# Patient Record
Sex: Female | Born: 1935
Health system: Southern US, Community
[De-identification: ages and names within clinical notes are randomized; demographics above are authoritative.]

---

## 2017-10-02 ENCOUNTER — Inpatient Hospital Stay
Admission: AC | Admit: 2017-10-02 | Discharge: 2017-10-25 | Disposition: A | Discharge status: Skilled Nursing Facility | Payer: Self-pay | Source: Other Acute Inpatient Hospital | Attending: Internal Medicine | Admitting: Internal Medicine

## 2017-10-02 DIAGNOSIS — Z9889 Other specified postprocedural states: Secondary | ICD-10-CM

## 2017-10-02 DIAGNOSIS — R110 Nausea: Secondary | ICD-10-CM

## 2017-10-02 DIAGNOSIS — I509 Heart failure, unspecified: Secondary | ICD-10-CM

## 2017-10-02 DIAGNOSIS — J9690 Respiratory failure, unspecified, unspecified whether with hypoxia or hypercapnia: Secondary | ICD-10-CM

## 2017-10-02 DIAGNOSIS — Z4659 Encounter for fitting and adjustment of other gastrointestinal appliance and device: Secondary | ICD-10-CM

## 2017-10-02 DIAGNOSIS — J90 Pleural effusion, not elsewhere classified: Secondary | ICD-10-CM

## 2017-10-03 ENCOUNTER — Other Ambulatory Visit (HOSPITAL_COMMUNITY): Payer: Self-pay

## 2017-10-03 LAB — BASIC METABOLIC PANEL
Anion gap: 5 (ref 5–15)
BUN: 8 mg/dL (ref 8–23)
CALCIUM: 8 mg/dL — AB (ref 8.9–10.3)
CO2: 26 mmol/L (ref 22–32)
CREATININE: 2.2 mg/dL — AB (ref 0.44–1.00)
Chloride: 102 mmol/L (ref 98–111)
GFR, EST AFRICAN AMERICAN: 22 mL/min — AB (ref >60)
GFR, EST NON AFRICAN AMERICAN: 19 mL/min — AB (ref >60)
GLUCOSE: 90 mg/dL (ref 70–99)
Potassium: 3.6 mmol/L (ref 3.5–5.1)
Sodium: 133 mmol/L — ABNORMAL LOW (ref 135–145)

## 2017-10-03 LAB — CBC WITH DIFFERENTIAL/PLATELET
Abs Immature Granulocytes: 0.2 10*3/uL — ABNORMAL HIGH (ref 0.0–0.1)
BASOS PCT: 1 %
Basophils Absolute: 0.1 10*3/uL (ref 0.0–0.1)
EOS ABS: 0.5 10*3/uL (ref 0.0–0.7)
EOS PCT: 4 %
HEMATOCRIT: 27.1 % — AB (ref 36.0–46.0)
Hemoglobin: 8.6 g/dL — ABNORMAL LOW (ref 12.0–15.0)
Immature Granulocytes: 2 %
LYMPHS ABS: 1.2 10*3/uL (ref 0.7–4.0)
Lymphocytes Relative: 9 %
MCH: 28.1 pg (ref 26.0–34.0)
MCHC: 31.7 g/dL (ref 30.0–36.0)
MCV: 88.6 fL (ref 78.0–100.0)
Monocytes Absolute: 1.6 10*3/uL — ABNORMAL HIGH (ref 0.1–1.0)
Monocytes Relative: 13 %
NEUTROS PCT: 71 %
Neutro Abs: 9.3 10*3/uL — ABNORMAL HIGH (ref 1.7–7.7)
PLATELETS: 298 10*3/uL (ref 150–400)
RBC: 3.06 MIL/uL — AB (ref 3.87–5.11)
RDW: 16.5 % — AB (ref 11.5–15.5)
WBC: 13 10*3/uL — ABNORMAL HIGH (ref 4.0–10.5)

## 2017-10-03 LAB — HEPATIC FUNCTION PANEL
ALBUMIN: 2.2 g/dL — AB (ref 3.5–5.0)
ALT: 12 U/L (ref 0–44)
AST: 22 U/L (ref 15–41)
Alkaline Phosphatase: 74 U/L (ref 38–126)
BILIRUBIN TOTAL: 0.8 mg/dL (ref 0.3–1.2)
Bilirubin, Direct: 0.2 mg/dL (ref 0.0–0.2)
Indirect Bilirubin: 0.6 mg/dL (ref 0.3–0.9)
TOTAL PROTEIN: 5.4 g/dL — AB (ref 6.5–8.1)

## 2017-10-03 LAB — MAGNESIUM: MAGNESIUM: 1.7 mg/dL (ref 1.7–2.4)

## 2017-10-03 LAB — PROTIME-INR
INR: 1.19
PROTHROMBIN TIME: 15 s (ref 11.4–15.2)

## 2017-10-03 LAB — APTT: APTT: 39 s — AB (ref 24–36)

## 2017-10-04 ENCOUNTER — Other Ambulatory Visit (HOSPITAL_COMMUNITY): Payer: Self-pay

## 2017-10-04 LAB — GRAM STAIN

## 2017-10-04 LAB — CK TOTAL AND CKMB (NOT AT ARMC)
CK TOTAL: 51 U/L (ref 38–234)
CK, MB: 3.6 ng/mL (ref 0.5–5.0)
CK, MB: 4.1 ng/mL (ref 0.5–5.0)
CK, MB: 4 ng/mL (ref 0.5–5.0)
RELATIVE INDEX: INVALID (ref 0.0–2.5)
Relative Index: INVALID (ref 0.0–2.5)
Relative Index: INVALID (ref 0.0–2.5)
Total CK: 29 U/L — ABNORMAL LOW (ref 38–234)
Total CK: 40 U/L (ref 38–234)

## 2017-10-04 LAB — ALBUMIN, PLEURAL OR PERITONEAL FLUID

## 2017-10-04 LAB — BASIC METABOLIC PANEL
ANION GAP: 8 (ref 5–15)
BUN: 12 mg/dL (ref 8–23)
CALCIUM: 9 mg/dL (ref 8.9–10.3)
CO2: 28 mmol/L (ref 22–32)
Chloride: 100 mmol/L (ref 98–111)
Creatinine, Ser: 2.78 mg/dL — ABNORMAL HIGH (ref 0.44–1.00)
GFR calc non Af Amer: 14 mL/min — ABNORMAL LOW (ref >60)
GFR, EST AFRICAN AMERICAN: 17 mL/min — AB (ref >60)
Glucose, Bld: 88 mg/dL (ref 70–99)
Potassium: 3.7 mmol/L (ref 3.5–5.1)
Sodium: 136 mmol/L (ref 135–145)

## 2017-10-04 LAB — T4, FREE: FREE T4: 1.11 ng/dL (ref 0.82–1.77)

## 2017-10-04 LAB — GLUCOSE, PLEURAL OR PERITONEAL FLUID: GLUCOSE FL: 108 mg/dL

## 2017-10-04 LAB — MAGNESIUM: Magnesium: 2.2 mg/dL (ref 1.7–2.4)

## 2017-10-04 LAB — PROTEIN, PLEURAL OR PERITONEAL FLUID

## 2017-10-04 LAB — TROPONIN I
TROPONIN I: 0.05 ng/mL — AB (ref <0.03)
TROPONIN I: 0.04 ng/mL — AB (ref <0.03)
TROPONIN I: 0.05 ng/mL — AB (ref <0.03)

## 2017-10-04 LAB — TSH: TSH: 17.65 u[IU]/mL — AB (ref 0.350–4.500)

## 2017-10-04 MED ORDER — LIDOCAINE HCL (PF) 1 % IJ SOLN
INTRAMUSCULAR | Status: AC
  Filled 2017-10-04: qty 30

## 2017-10-04 NOTE — Consult Note (Signed)
[facility] CONSULT NOTE    Date: 10/04/2017                  Patient Name:  Morgan Sosa  MRN: [id number]  DOB: 11/13/1930  Age / Sex: 84 y.o., female         PCP: No primary care provider on file.                 Service Requesting Consult: Hospitalist                 Reason for Consult: Acute renal failure            History of Present Illness: Patient is a 84 y.o. female with a PMHx of diastolic heart failure, right-sided pleural effusion with chronic Pleurx catheter since April 2019, paroxysmal atrial fibrillation, hypertension, hyperlipidemia who was originally admitted to [facility] on September 18, 2017 with right-sided chest pain.  She was apparently having issues with her Pleurx catheter.  After admission she underwent bilateral thoracentesis.  Surgery subsequently removed her Pleurx catheter as it was felt that it may possibly be infected.  Patient was also diagnosed with sepsis and hospital-acquired pneumonia and admission.  She was initially started on vancomycin and Zosyn.  Unfortunately she underwent ventricular fibrillation arrest on September 25, 2017.  She was thereafter transferred to intensive care unit and intubated and extubated the same day.  She subsequently developed acute renal failure that ended up requiring dialysis treatment.  The patient's daughter believes that she received at least 3 dialysis treatments.  Her last dialysis treatment was on Saturday.  Currently creatinine is 2.8.  Urine output overall remains low.  Medications: Outpatient medications: No medications prior to admission.    Current medications: Current Facility-Administered Medications  Medication Dose Route Frequency Provider Last Rate Last Dose  . lidocaine (PF) (XYLOCAINE) 1 % injection               Allergies: Allergies not on file    Past Medical History: diastolic heart failure, right-sided pleural effusion with chronic Pleurx catheter since  April 2019, paroxysmal atrial fibrillation, hypertension, hyperlipidemia  Past Surgical History: History of Pleurx catheter placement.  Family History: No family history of end-stage renal disease.  Social History: Lives near [place].  No current tobacco, alcohol use.  Review of Systems: Patient unable to provide review of systems at this time given altered mental status.  Vital Signs: Temperature 96.5 pulse 70 respirations 21 blood pressure 140/80 Weight trends: There were no vitals filed for this visit.  Physical Exam: General: NAD, resting in bed comfortably.  Head: Normocephalic, atraumatic.  Eyes: Anicteric  Nose: Mucous membranes moist, not inflammed, nonerythematous.  Throat: Oropharynx nonerythematous, no exudate appreciated.   Neck: Supple, trachea midline.  Lungs:  Scattered rhonchi, normal effort  Heart: S1S2 no obvious rub  Abdomen:  BS normoactive. Soft, Nondistended, non-tender.  No masses or organomegaly.  Extremities: Trace LE edema  Neurologic: Lethargic, not following commands  Skin: No visible rashes, scars.    Lab results: Basic Metabolic Panel: Recent Labs  Lab 10/03/17 0900 10/04/17 0607  NA 133* 136  K 3.6 3.7  CL 102 100  CO2 26 28  GLUCOSE 90 88  BUN 8 12  CREATININE 2.20* 2.78*  CALCIUM 8.0* 9.0  MG 1.7 2.2    Liver Function Tests: Recent Labs  Lab 10/03/17 0900  AST 22  ALT 12  ALKPHOS 74  BILITOT 0.8  PROT  5.4*  ALBUMIN 2.2*   No results for input(s): LIPASE, AMYLASE in the last 168 hours. No results for input(s): AMMONIA in the last 168 hours.  CBC: Recent Labs  Lab 10/03/17 0900  WBC 13.0*  NEUTROABS 9.3*  HGB 8.6*  HCT 27.1*  MCV 88.6  PLT 298    Cardiac Enzymes: Recent Labs  Lab 10/04/17 0649 10/04/17 1232  CKTOTAL 29* 40  CKMB 3.6 4.1  TROPONINI 0.05* 0.04*    BNP: Invalid input(s): POCBNP  CBG: No results for input(s): GLUCAP in the last 168 hours.  Microbiology: Results for  orders placed or performed during the hospital encounter of 10/02/17  Gram stain     Status: None   Collection Time: 10/04/17 12:53 PM  Result Value Ref Range Status   Specimen Description PLEURAL  Final   Special Requests NONE  Final   Gram Stain   Final    FEW WBC PRESENT,BOTH PMN AND MONONUCLEAR NO ORGANISMS SEEN Performed at [facility], [address] [place], [place] [address]    Report Status 10/04/2017 FINAL  Final    Coagulation Studies: Recent Labs    10/03/17 0900  LABPROT 15.0  INR 1.19    Urinalysis: No results for input(s): COLORURINE, LABSPEC, PHURINE, GLUCOSEU, HGBUR, BILIRUBINUR, KETONESUR, PROTEINUR, UROBILINOGEN, NITRITE, LEUKOCYTESUR in the last 72 hours.  Invalid input(s): APPERANCEUR    Imaging: Dg Chest Port 1 View  Result Date: 10/04/2017 CLINICAL DATA:  84 y/o  F; post right-sided thoracentesis. EXAM: PORTABLE CHEST 1 VIEW COMPARISON:  10/03/2017 chest radiograph. FINDINGS: Stable cardiomegaly, largely obscured by left-sided effusion. Aortic atherosclerosis with calcification. Left larger than right pleural effusions are stable given projection and technique. No pneumothorax. Pulmonary vascular congestion. No acute osseous abnormality is evident. IMPRESSION: 1. Stable left larger than right pleural effusions. No pneumothorax. 2. Stable basilar opacities probably representing associated atelectasis. 3. Pulmonary vascular congestion and cardiomegaly. Electronically Signed   By: Mitzi HansenLance  Furusawa-Stratton M.D.   On: 10/04/2017 14:00   Dg Chest Port 1 View  Result Date: 10/03/2017 CLINICAL DATA:  CHF EXAM: PORTABLE CHEST 1 VIEW COMPARISON:  None. FINDINGS: Mild cardiomegaly. Aortic atherosclerosis. Otherwise normal mediastinal contour. No pneumothorax. Moderate to large left and small right pleural effusions. Mild-to-moderate pulmonary edema. Left greater than right lung base consolidation. IMPRESSION: 1. Mild cardiomegaly with mild-to-moderate pulmonary  edema, compatible with congestive heart failure. 2. Moderate to large left and small right pleural effusions with bibasilar consolidation favoring atelectasis. Electronically Signed   By: Delbert PhenixJason A Poff M.D.   On: 10/03/2017 07:07   [place] Thoracentesis Asp Pleural Space W/img Guide  Result Date: 10/04/2017 INDICATION: Patient with left pleural effusion. Request is made for diagnostic and therapeutic left thoracentesis. EXAM: ULTRASOUND GUIDED DIAGNOSTIC AND THERAPEUTIC LEFT THORACENTESIS MEDICATIONS: 10 mL 1% lidocaine COMPLICATIONS: None immediate. PROCEDURE: An ultrasound guided thoracentesis was thoroughly discussed with the patient and questions answered. The benefits, risks, alternatives and complications were also discussed. The patient understands and wishes to proceed with the procedure. Written consent was obtained. Ultrasound was performed to localize and mark an adequate pocket of fluid in the left chest. The area was then prepped and draped in the normal sterile fashion. 1% Lidocaine was used for local anesthesia. Under ultrasound guidance a 6 Fr Safe-T-Centesis catheter was introduced. Thoracentesis was performed. The catheter was removed and a dressing applied. FINDINGS: A total of approximately 700 mL of yellow fluid was removed. Samples were sent to the laboratory as requested by the clinical team. IMPRESSION:  Successful ultrasound guided diagnostic and therapeutic left thoracentesis yielding 700 mL of pleural fluid. Read by: Loyce Dys PA-C Electronically Signed   By: Simonne Come M.D.   On: 10/04/2017 14:19      Assessment & Plan: Pt is a 84 y.o. female with a PMHx of diastolic heart failure, right-sided pleural effusion with chronic Pleurx catheter since April 2019, paroxysmal atrial fibrillation, hypertension, hyperlipidemia who was originally admitted to [facility] on September 18, 2017 with right-sided chest pain.  She was apparently having issues with her Pleurx  catheter.  After admission she underwent bilateral thoracentesis.  Surgery subsequently removed her Pleurx catheter as it was felt that it may possibly be infected.  Patient was also diagnosed with sepsis and hospital-acquired pneumonia and admission.  She was initially started on vancomycin and Zosyn.  Unfortunately she underwent ventricular fibrillation arrest on September 25, 2017.  1.  Acute renal failure.  2.  Anemia unspecified.  3.  Acute on chronic diastolic heart failure.  4.  Bilateral pleural effusions.  Plan:  The patient developed recent acute renal failure likely related to recent cardiac arrest and renal hypoperfusion.  Creatinine currently up to 2.78.  She has been undergoing hemodialysis.  Upon admission creatinine was 2.2.  Hold off on reinitiation of dialysis at the moment but there is a strong possibility that we may need to reinitiate this later this week depending upon her future urine output.  This was discussed with the patient's daughter in depth today.  Further plan as patient progresses.  Thanks for consultation.

## 2017-10-04 NOTE — Procedures (Signed)
PROCEDURE SUMMARY:  Successful US guided left diagnostic and therapeutic thoracentesis. Yielded 700 mL of yellow fluid. Pt tolerated procedure well. No immediate complications.  Specimen was sent for labs. CXR ordered.  Hoyt KochKacie Sue-Ellen Florance Paolillo PA-C 10/04/2017 12:02 PM

## 2017-10-05 LAB — RENAL FUNCTION PANEL
ANION GAP: 9 (ref 5–15)
Albumin: 2.2 g/dL — ABNORMAL LOW (ref 3.5–5.0)
BUN: 15 mg/dL (ref 8–23)
CALCIUM: 9.2 mg/dL (ref 8.9–10.3)
CHLORIDE: 99 mmol/L (ref 98–111)
CO2: 27 mmol/L (ref 22–32)
Creatinine, Ser: 3.3 mg/dL — ABNORMAL HIGH (ref 0.44–1.00)
GFR, EST AFRICAN AMERICAN: 14 mL/min — AB (ref >60)
GFR, EST NON AFRICAN AMERICAN: 12 mL/min — AB (ref >60)
Glucose, Bld: 85 mg/dL (ref 70–99)
Phosphorus: 4.9 mg/dL — ABNORMAL HIGH (ref 2.5–4.6)
Potassium: 3.7 mmol/L (ref 3.5–5.1)
Sodium: 135 mmol/L (ref 135–145)

## 2017-10-05 LAB — CBC
HEMATOCRIT: 27.6 % — AB (ref 36.0–46.0)
HEMOGLOBIN: 8.2 g/dL — AB (ref 12.0–15.0)
MCH: 27.7 pg (ref 26.0–34.0)
MCHC: 29.7 g/dL — ABNORMAL LOW (ref 30.0–36.0)
MCV: 93.2 fL (ref 78.0–100.0)
Platelets: 319 10*3/uL (ref 150–400)
RBC: 2.96 MIL/uL — AB (ref 3.87–5.11)
RDW: 17.2 % — ABNORMAL HIGH (ref 11.5–15.5)
WBC: 11.5 10*3/uL — AB (ref 4.0–10.5)

## 2017-10-05 LAB — MAGNESIUM: MAGNESIUM: 2 mg/dL (ref 1.7–2.4)

## 2017-10-05 LAB — PH, BODY FLUID: pH, Body Fluid: 7.5

## 2017-10-06 ENCOUNTER — Other Ambulatory Visit (HOSPITAL_COMMUNITY): Payer: Self-pay

## 2017-10-06 LAB — BLOOD GAS, ARTERIAL
Acid-Base Excess: 3.3 mmol/L — ABNORMAL HIGH (ref 0.0–2.0)
Bicarbonate: 28.2 mmol/L — ABNORMAL HIGH (ref 20.0–28.0)
O2 Content: 6 L/min
O2 Saturation: 98.4 %
Patient temperature: 98.6
pCO2 arterial: 50.7 mmHg — ABNORMAL HIGH (ref 32.0–48.0)
pH, Arterial: 7.364 (ref 7.350–7.450)
pO2, Arterial: 120 mmHg — ABNORMAL HIGH (ref 83.0–108.0)

## 2017-10-06 LAB — RENAL FUNCTION PANEL
Albumin: 2.3 g/dL — ABNORMAL LOW (ref 3.5–5.0)
Anion gap: 12 (ref 5–15)
BUN: 22 mg/dL (ref 8–23)
CALCIUM: 9.8 mg/dL (ref 8.9–10.3)
CHLORIDE: 97 mmol/L — AB (ref 98–111)
CO2: 26 mmol/L (ref 22–32)
CREATININE: 3.7 mg/dL — AB (ref 0.44–1.00)
GFR calc Af Amer: 12 mL/min — ABNORMAL LOW (ref >60)
GFR calc non Af Amer: 10 mL/min — ABNORMAL LOW (ref >60)
GLUCOSE: 76 mg/dL (ref 70–99)
Phosphorus: 5 mg/dL — ABNORMAL HIGH (ref 2.5–4.6)
Potassium: 4.5 mmol/L (ref 3.5–5.1)
SODIUM: 135 mmol/L (ref 135–145)

## 2017-10-06 LAB — PROTEIN, PLEURAL OR PERITONEAL FLUID: Total protein, fluid: <3 g/dL

## 2017-10-06 LAB — GRAM STAIN

## 2017-10-06 LAB — CBC
HCT: 29.3 % — ABNORMAL LOW (ref 36.0–46.0)
Hemoglobin: 8.7 g/dL — ABNORMAL LOW (ref 12.0–15.0)
MCH: 27.2 pg (ref 26.0–34.0)
MCHC: 29.7 g/dL — AB (ref 30.0–36.0)
MCV: 91.6 fL (ref 78.0–100.0)
PLATELETS: 332 10*3/uL (ref 150–400)
RBC: 3.2 MIL/uL — AB (ref 3.87–5.11)
RDW: 17.2 % — ABNORMAL HIGH (ref 11.5–15.5)
WBC: 10 10*3/uL (ref 4.0–10.5)

## 2017-10-06 LAB — GLUCOSE, PLEURAL OR PERITONEAL FLUID: Glucose, Fluid: 101 mg/dL

## 2017-10-06 LAB — ALBUMIN, PLEURAL OR PERITONEAL FLUID

## 2017-10-06 LAB — MAGNESIUM: Magnesium: 2.1 mg/dL (ref 1.7–2.4)

## 2017-10-06 MED ORDER — LIDOCAINE HCL (PF) 1 % IJ SOLN
INTRAMUSCULAR | Status: AC
  Filled 2017-10-06: qty 10

## 2017-10-06 NOTE — Progress Notes (Signed)
[facility]  ROUNDING NOTE   Subjective:  Patient underwent left thoracentesis today. 775 cc of hazy fluid was removed.   Objective:  Vital signs in last 24 hours:  Temperature 97.8 pulse 86 respirations 20 blood pressure 133/55  Physical Exam: General: No acute distress  Head: Normocephalic, atraumatic. Moist oral mucosal membranes  Eyes: Anicteric  Neck: Supple, trachea midline  Lungs:   Diminished at bases, normal effort  Heart: S1S2 no rubs  Abdomen:  Soft, nontender, bowel sounds present  Extremities: 1+ peripheral edema.  Neurologic: Arousable, but not following commands  Skin: Raised lesion on chest wall  Access: Right femoral dialysis catheter    Basic Metabolic Panel: Recent Labs  Lab 10/03/17 0900 10/04/17 0607 10/05/17 0716 10/06/17 0633  NA 133* 136 135 135  K 3.6 3.7 3.7 4.5  CL 102 100 99 97*  CO2 26 28 27 26   GLUCOSE 90 88 85 76  BUN 8 12 15 22   CREATININE 2.20* 2.78* 3.30* 3.70*  CALCIUM 8.0* 9.0 9.2 9.8  MG 1.7 2.2 2.0 2.1  PHOS  --   --  4.9* 5.0*    Liver Function Tests: Recent Labs  Lab 10/03/17 0900 10/05/17 0716 10/06/17 0633  AST 22  --   --   ALT 12  --   --   ALKPHOS 74  --   --   BILITOT 0.8  --   --   PROT 5.4*  --   --   ALBUMIN 2.2* 2.2* 2.3*   No results for input(s): LIPASE, AMYLASE in the last 168 hours. No results for input(s): AMMONIA in the last 168 hours.  CBC: Recent Labs  Lab 10/03/17 0900 10/05/17 0716 10/06/17 0633  WBC 13.0* 11.5* 10.0  NEUTROABS 9.3*  --   --   HGB 8.6* 8.2* 8.7*  HCT 27.1* 27.6* 29.3*  MCV 88.6 93.2 91.6  PLT 298 319 332    Cardiac Enzymes: Recent Labs  Lab 10/04/17 0649 10/04/17 1232 10/04/17 2129  CKTOTAL 29* 40 51  CKMB 3.6 4.1 4.0  TROPONINI 0.05* 0.04* 0.05*    BNP: Invalid input(s): POCBNP  CBG: No results for input(s): GLUCAP in the last 168 hours.  Microbiology: Results for orders placed or performed during the hospital encounter of 10/02/17   Culture, body fluid-bottle     Status: None (Preliminary result)   Collection Time: 10/04/17 12:53 PM  Result Value Ref Range Status   Specimen Description PLEURAL  Final   Special Requests NONE  Final   Culture   Final    NO GROWTH 2 DAYS Performed at [facility], [address] [place], [place] [address]    Report Status PENDING  Incomplete  Gram stain     Status: None   Collection Time: 10/04/17 12:53 PM  Result Value Ref Range Status   Specimen Description PLEURAL  Final   Special Requests NONE  Final   Gram Stain   Final    FEW WBC PRESENT,BOTH PMN AND MONONUCLEAR NO ORGANISMS SEEN Performed at [facility], [address] [place],  [address]    Report Status 10/04/2017 FINAL  Final    Coagulation Studies: No results for input(s): LABPROT, INR in the last 72 hours.  Urinalysis: No results for input(s): COLORURINE, LABSPEC, PHURINE, GLUCOSEU, HGBUR, BILIRUBINUR, KETONESUR, PROTEINUR, UROBILINOGEN, NITRITE, LEUKOCYTESUR in the last 72 hours.  Invalid input(s): APPERANCEUR    Imaging: Dg Chest Port 1 View  Result Date: 10/06/2017 CLINICAL DATA:  84 y/o F; status post thoracentesis. Pleural effusion on the left. EXAM: PORTABLE CHEST 1 VIEW COMPARISON:  10/06/2017 chest radiograph FINDINGS: Small residual left and stable moderate right pleural effusions. Stable interstitial and alveolar pulmonary edema. Stable cardiomegaly and aortic atherosclerosis. No pneumothorax. Bones are unremarkable. IMPRESSION: Small residual left and stable moderate right pleural effusions. No pneumothorax. Stable pulmonary edema and cardiomegaly. Electronically Signed   By: Kristine Garbe M.D.   On: 10/06/2017 16:47   Dg Chest Port 1 View  Result Date: 10/06/2017 CLINICAL DATA:  Shortness of breath. Follow-up CHF and BILATERAL pleural effusions. EXAM: PORTABLE CHEST 1 VIEW COMPARISON:  10/04/2017, 10/03/2017. FINDINGS: Cardiac silhouette markedly enlarged. Thoracic  aorta atherosclerotic. Prominent central pulmonary arteries. Mild diffuse interstitial opacities throughout both lungs, improved since yesterday. Large BILATERAL pleural effusions, LEFT greater than RIGHT, including fluid in the major fissure on the RIGHT, unchanged. No new abnormalities. IMPRESSION: 1. Improvement in CHF since yesterday, though mild interstitial edema persists. 2. Stable large BILATERAL pleural effusions, LEFT greater than RIGHT. 3. No new abnormalities. Electronically Signed   By: Evangeline Dakin M.D.   On: 10/06/2017 08:54   US Thoracentesis Asp Pleural Space W/img Guide  Result Date: 10/06/2017 INDICATION: Shortness of breath. Recurrent left pleural effusion. Request diagnostic and therapeutic thoracentesis. EXAM: ULTRASOUND GUIDED LEFT THORACENTESIS MEDICATIONS: None. COMPLICATIONS: None immediate. PROCEDURE: An ultrasound guided thoracentesis was thoroughly discussed with the patient and questions answered. The benefits, risks, alternatives and complications were also discussed. The patient understands and wishes to proceed with the procedure. Written consent was obtained. Ultrasound was performed to localize and mark an adequate pocket of fluid in the left chest. The area was then prepped and draped in the normal sterile fashion. 1% Lidocaine was used for local anesthesia. Under ultrasound guidance a 6 Fr Safe-T-Centesis catheter was introduced. Thoracentesis was performed. The catheter was removed and a dressing applied. FINDINGS: A total of approximately 775 mL of hazy yellow fluid was removed. Samples were sent to the laboratory as requested by the clinical team. IMPRESSION: Successful ultrasound guided left thoracentesis yielding 775 mL of pleural fluid. Read by: Ascencion Dike PA-C Electronically Signed   By: Marybelle Killings M.D.   On: 10/06/2017 16:11     Medications:    . lidocaine (PF)         Assessment/ Plan:  84 y.o. female with a PMHx of diastolic heart failure,  right-sided pleural effusion with chronic Pleurx catheter since April 2019, paroxysmal atrial fibrillation, hypertension, hyperlipidemia who was originally admitted to [facility] on September 18, 2017 with right-sided chest pain.  She was apparently having issues with her Pleurx catheter.  After admission she underwent bilateral thoracentesis.  Surgery subsequently removed her Pleurx catheter as it was felt that it may possibly be infected.  Patient was also diagnosed with sepsis and hospital-acquired pneumonia. She was initially started on vancomycin and Zosyn.  Unfortunately she underwent ventricular fibrillation arrest on September 25, 2017.  Patient also had dialysis at outside hospital.   1.  Acute renal failure.  2.  Anemia unspecified.  3.  Acute on chronic diastolic heart failure.  4.  Bilateral pleural effusions.  Plan:  Renal function continues to deteriorate.  Creatinine up to 3.70 with an EGFR of 10.  As such we will go ahead and reinitiate the patient on dialysis starting tomorrow.  We will plan for dialysis treatment of 2 hours, blood flow rate 200, dialysate flow rate of 300, and ultrafiltration of 0.5 kg.  She will likely need dialysis on Saturday as well.  Hemoglobin up a bit to 8.7.  Patient underwent left-sided thoracentesis today and tolerated well.  Overall prognosis guarded.     LOS: 0 Rilea Arutyunyan 7/10/20195:21 PM

## 2017-10-06 NOTE — Procedures (Signed)
PROCEDURE SUMMARY:  Successful US guided left thoracentesis. Yielded 775 mL of hazy yellow fluid. Pt tolerated procedure well. No immediate complications.  Specimen was sent for labs. CXR ordered.  Brayton ElBRUNING, Adison Jerger PA-C 10/06/2017 4:06 PM

## 2017-10-07 ENCOUNTER — Other Ambulatory Visit (HOSPITAL_COMMUNITY): Payer: Self-pay

## 2017-10-07 LAB — RENAL FUNCTION PANEL
ALBUMIN: 2.1 g/dL — AB (ref 3.5–5.0)
Anion gap: 12 (ref 5–15)
BUN: 29 mg/dL — AB (ref 8–23)
CALCIUM: 9.7 mg/dL (ref 8.9–10.3)
CHLORIDE: 96 mmol/L — AB (ref 98–111)
CO2: 27 mmol/L (ref 22–32)
CREATININE: 4.43 mg/dL — AB (ref 0.44–1.00)
GFR calc Af Amer: 10 mL/min — ABNORMAL LOW (ref >60)
GFR, EST NON AFRICAN AMERICAN: 8 mL/min — AB (ref >60)
Glucose, Bld: 70 mg/dL (ref 70–99)
Phosphorus: 5 mg/dL — ABNORMAL HIGH (ref 2.5–4.6)
Potassium: 4.6 mmol/L (ref 3.5–5.1)
SODIUM: 135 mmol/L (ref 135–145)

## 2017-10-07 LAB — CBC
HEMATOCRIT: 27.1 % — AB (ref 36.0–46.0)
Hemoglobin: 8.4 g/dL — ABNORMAL LOW (ref 12.0–15.0)
MCH: 28 pg (ref 26.0–34.0)
MCHC: 31 g/dL (ref 30.0–36.0)
MCV: 90.3 fL (ref 78.0–100.0)
PLATELETS: 339 10*3/uL (ref 150–400)
RBC: 3 MIL/uL — ABNORMAL LOW (ref 3.87–5.11)
RDW: 17.3 % — AB (ref 11.5–15.5)
WBC: 9.4 10*3/uL (ref 4.0–10.5)

## 2017-10-07 LAB — PH, BODY FLUID: pH, Body Fluid: 7.4

## 2017-10-07 LAB — MAGNESIUM: MAGNESIUM: 2 mg/dL (ref 1.7–2.4)

## 2017-10-07 NOTE — Consult Note (Signed)
Referring Physician:  [name] is an 84 y.o. female.                       Chief Complaint: Acute respiratory failure  HPI: 84 year old female with acute renal failure has large bilateral pleural effusions, left larger than right ( right larger than left post thoracentesis yesterday ) with chronic need for supplemental oxygen. Her EKG shows SR with lateral wall ischemia. She denies chest pain. She has minimally elevated Troponin I on 10/04/2017. She has anemia of chronic disease. Her creatinine is 4.43 today. She has very low albumin level of 2.2 g/dL Her TSH is elevated at 17.65 with normal T4 level.  She had 700 + CC of thoracentesis fluid recovered on 10/04/2017 and yesterday. Prior pleural fluid collection has no growth on culture in 3 days.  Past medical history: No DM, II, Positive HTN, No smoking, No drug use.    The histories are not reviewed yet. Please review them in the "History" navigator section and refresh this [place].  No family history on file. Social History:  has no tobacco, alcohol, and drug history on file.  Allergies: Allergies not on file  No medications prior to admission.    Results for orders placed or performed during the hospital encounter of 10/02/17 (from the past 48 hour(s))  Blood gas, arterial     Status: Abnormal   Collection Time: 10/06/17  6:05 AM  Result Value Ref Range   O2 Content 6.0 L/min   Delivery systems NASAL CANNULA    pH, Arterial 7.364 7.350 - 7.450   pCO2 arterial 50.7 (H) 32.0 - 48.0 mmHg   pO2, Arterial 120 (H) 83.0 - 108.0 mmHg   Bicarbonate 28.2 (H) 20.0 - 28.0 mmol/L   Acid-Base Excess 3.3 (H) 0.0 - 2.0 mmol/L   O2 Saturation 98.4 %   Patient temperature 98.6    Collection site LEFT RADIAL    Drawn by Roseanne Reno RRT    Sample type ARTERIAL    Allens test (pass/fail) PASS PASS  CBC     Status: Abnormal   Collection Time: 10/06/17  6:33 AM  Result Value Ref Range   WBC 10.0 4.0 - 10.5 K/uL   RBC 3.20 (L) 3.87 - 5.11  MIL/uL   Hemoglobin 8.7 (L) 12.0 - 15.0 g/dL   HCT 29.3 (L) 36.0 - 46.0 %   MCV 91.6 78.0 - 100.0 fL   MCH 27.2 26.0 - 34.0 pg   MCHC 29.7 (L) 30.0 - 36.0 g/dL   RDW 17.2 (H) 11.5 - 15.5 %   Platelets 332 150 - 400 K/uL    Comment: Performed at [facility], [address], [place], [place] [address]  Renal function panel     Status: Abnormal   Collection Time: 10/06/17  6:33 AM  Result Value Ref Range   Sodium 135 135 - 145 mmol/L   Potassium 4.5 3.5 - 5.1 mmol/L   Chloride 97 (L) 98 - 111 mmol/L    Comment: Please note change in reference range.   CO2 26 22 - 32 mmol/L   Glucose, Bld 76 70 - 99 mg/dL    Comment: Please note change in reference range.   BUN 22 8 - 23 mg/dL    Comment: Please note change in reference range.   Creatinine, Ser 3.70 (H) 0.44 - 1.00 mg/dL   Calcium 9.8 8.9 - 10.3 mg/dL   Phosphorus 5.0 (H) 2.5 -  4.6 mg/dL   Albumin 2.3 (L) 3.5 - 5.0 g/dL   GFR calc non Af Amer 10 (L) >60 mL/min   GFR calc Af Amer 12 (L) >60 mL/min    Comment: (NOTE) The eGFR has been calculated using the CKD EPI equation. This calculation has not been validated in all clinical situations. eGFR's persistently <60 mL/min signify possible Chronic Kidney Disease.    Anion gap 12 5 - 15    Comment: Performed at [facility] [address], [place], [place] [address]  Magnesium     Status: None   Collection Time: 10/06/17  6:33 AM  Result Value Ref Range   Magnesium 2.1 1.7 - 2.4 mg/dL    Comment: Performed at [facility] [address] [place], [place] [address]  Albumin, pleural or peritoneal fluid     Status: None   Collection Time: 10/06/17  4:14 PM  Result Value Ref Range   Albumin, Fluid <1.0 g/dL    Comment: (NOTE) No normal range established for this test Results should be evaluated in conjunction with serum values    Fluid Type-FALB Pleural, L     Comment: Performed at [facility] [address], [place], [place] [address]  Protein,  pleural or peritoneal fluid     Status: None   Collection Time: 10/06/17  4:14 PM  Result Value Ref Range   Total protein, fluid <3.0 g/dL    Comment: (NOTE) No normal range established for this test Results should be evaluated in conjunction with serum values    Fluid Type-FTP Pleural, L     Comment: Performed at [facility] [address] [place], [place] [address]  Glucose, pleural or peritoneal fluid     Status: None   Collection Time: 10/06/17  4:14 PM  Result Value Ref Range   Glucose, Fluid 101 mg/dL    Comment: (NOTE) No normal range established for this test Results should be evaluated in conjunction with serum values    Fluid Type-FGLU Pleural, L     Comment: Performed at [facility] [address], [place], [place] [address]  Gram stain     Status: None   Collection Time: 10/06/17  4:14 PM  Result Value Ref Range   Specimen Description PLEURAL LEFT    Special Requests NONE    Gram Stain      ABUNDANT WBC PRESENT, PREDOMINANTLY MONONUCLEAR NO ORGANISMS SEEN Performed at [facility], [place] [address], [place], [place] [address]    Report Status 10/06/2017 FINAL   CBC     Status: Abnormal   Collection Time: 10/07/17  7:00 AM  Result Value Ref Range   WBC 9.4 4.0 - 10.5 K/uL   RBC 3.00 (L) 3.87 - 5.11 MIL/uL   Hemoglobin 8.4 (L) 12.0 - 15.0 g/dL   HCT 27.1 (L) 36.0 - 46.0 %   MCV 90.3 78.0 - 100.0 fL   MCH 28.0 26.0 - 34.0 pg   MCHC 31.0 30.0 - 36.0 g/dL   RDW 17.3 (H) 11.5 - 15.5 %   Platelets 339 150 - 400 K/uL    Comment: Performed at [facility], [place] [address] [place], [place] [address]  Magnesium     Status: None   Collection Time: 10/07/17  7:00 AM  Result Value Ref Range   Magnesium 2.0 1.7 - 2.4 mg/dL    Comment: Performed at [facility], [place]  [address] [place], [place] [address]  Renal function panel     Status: Abnormal   Collection Time: 10/07/17  7:00 AM  Result Value Ref Range   Sodium 135 135 - 145 mmol/L    Potassium 4.6 3.5 - 5.1 mmol/L   Chloride 96 (L) 98 - 111 mmol/L    Comment: Please note change in reference range.   CO2 27 22 - 32 mmol/L   Glucose, Bld 70 70 - 99 mg/dL    Comment: Please note change in reference range.   BUN 29 (H) 8 - 23 mg/dL    Comment: Please note change in reference range.   Creatinine, Ser 4.43 (H) 0.44 - 1.00 mg/dL   Calcium 9.7 8.9 - 10.3 mg/dL   Phosphorus 5.0 (H) 2.5 - 4.6 mg/dL   Albumin 2.1 (L) 3.5 - 5.0 g/dL   GFR calc non Af Amer 8 (L) >60 mL/min   GFR calc Af Amer 10 (L) >60 mL/min    Comment: (NOTE) The eGFR has been calculated using the CKD EPI equation. This calculation has not been validated in all clinical situations. eGFR's persistently <60 mL/min signify possible Chronic Kidney Disease.    Anion gap 12 5 - 15    Comment: Performed at [facility] [address] [place], [place] [address]   Dg Chest Port 1 View  Result Date: 10/06/2017 CLINICAL DATA:  84 y/o F; status post thoracentesis. Pleural effusion on the left. EXAM: PORTABLE CHEST 1 VIEW COMPARISON:  10/06/2017 chest radiograph FINDINGS: Small residual left and stable moderate right pleural effusions. Stable interstitial and alveolar pulmonary edema. Stable cardiomegaly and aortic atherosclerosis. No pneumothorax. Bones are unremarkable. IMPRESSION: Small residual left and stable moderate right pleural effusions. No pneumothorax. Stable pulmonary edema and cardiomegaly. Electronically Signed   By: Kristine Garbe M.D.   On: 10/06/2017 16:47   Dg Chest Port 1 View  Result Date: 10/06/2017 CLINICAL DATA:  Shortness of breath. Follow-up CHF and BILATERAL pleural effusions. EXAM: PORTABLE CHEST 1 VIEW COMPARISON:  10/04/2017, 10/03/2017. FINDINGS: Cardiac silhouette markedly enlarged. Thoracic aorta atherosclerotic. Prominent central pulmonary arteries. Mild diffuse interstitial opacities throughout both lungs, improved since yesterday. Large BILATERAL pleural effusions,  LEFT greater than RIGHT, including fluid in the major fissure on the RIGHT, unchanged. No new abnormalities. IMPRESSION: 1. Improvement in CHF since yesterday, though mild interstitial edema persists. 2. Stable large BILATERAL pleural effusions, LEFT greater than RIGHT. 3. No new abnormalities. Electronically Signed   By: Evangeline Dakin M.D.   On: 10/06/2017 08:54   US Thoracentesis Asp Pleural Space W/img Guide  Result Date: 10/06/2017 INDICATION: Shortness of breath. Recurrent left pleural effusion. Request diagnostic and therapeutic thoracentesis. EXAM: ULTRASOUND GUIDED LEFT THORACENTESIS MEDICATIONS: None. COMPLICATIONS: None immediate. PROCEDURE: An ultrasound guided thoracentesis was thoroughly discussed with the patient and questions answered. The benefits, risks, alternatives and complications were also discussed. The patient understands and wishes to proceed with the procedure. Written consent was obtained. Ultrasound was performed to localize and mark an adequate pocket of fluid in the left chest. The area was then prepped and draped in the normal sterile fashion. 1% Lidocaine was used for local anesthesia. Under ultrasound guidance a 6 Fr Safe-T-Centesis catheter was introduced. Thoracentesis was performed. The catheter was removed and a dressing applied. FINDINGS: A total of approximately 775 mL of hazy yellow fluid was removed. Samples were sent to the laboratory as requested by the clinical team. IMPRESSION: Successful ultrasound guided left thoracentesis yielding 775 mL of pleural fluid.  Read by: Ascencion Dike PA-C Electronically Signed   By: Marybelle Killings M.D.   On: 10/06/2017 16:11    Review Of Systems Constitutional: Positive fever, chills, weight loss. Eyes: No vision change, wears glasses. No discharge or pain. Ears: Severe hearing loss, No tinnitus. Respiratory: No asthma, COPD, pneumonias. Positrive shortness of breath. No hemoptysis. Cardiovascular: No chest pain, palpitation,  positive leg edema. Gastrointestinal: No nausea, vomiting, diarrhea, constipation. No GI bleed. No hepatitis. Genitourinary: No dysuria, hematuria, kidney stone. No incontinance. Neurological: No headache, stroke, seizures.  Psychiatry: No psych facility admission for anxiety, depression, suicide. No detox. Skin: No rash. Musculoskeletal: Positive joint pain, fibromyalgia. No neck pain, back pain. Lymphadenopathy: No lymphadenopathy. Hematology: Positive anemia.   There were no vitals taken for this visit. There is no height or weight on file to calculate BMI. General appearance: alert, cooperative, appears stated age and mild respiratory distress Head: Normocephalic, atraumatic. Eyes: Blue eyes, pale conjunctiva, corneas clear.  Neck: No adenopathy, no carotid bruit, + JVD, supple, symmetrical, trachea midline and thyroid not enlarged. Resp: Basal crackles to auscultation bilaterally. Cardio: Regular rate and rhythm, S1, S2 normal, II/VI systolic murmur, no click, rub or gallop GI: Soft, non-tender; bowel sounds normal; no organomegaly. Extremities: Trace edema, cyanosis or clubbing. Skin: Warm and dry.  Neurologic: Alert and oriented X 3, normal strength. Normal coordination and gait.  Assessment/Plan Acute renal failure Acute respiratory failure with hypoxemia Bilateral pleural effusion, left larger than right. R/O ischemic cardiomyopathy Diastolic heart failure R/O systolic heart failure H/O HCAP H/O V. Fib. Arrest Anemia of iron deficiency and chronic disease Hypoproteinemia Hypothyroidism  Echocardiogram for LV function. Small dose Levothyroxine. Patient is DNR now Avoid cardiac interventions for now.  Birdie Riddle, MD  10/07/2017, 12:55 PM

## 2017-10-07 NOTE — Progress Notes (Signed)
  Echocardiogram 2D Echocardiogram has been performed.  Morgan SkeenVijay  Morgan Sosa 10/07/2017, 4:45 PM

## 2017-10-08 ENCOUNTER — Other Ambulatory Visit (HOSPITAL_COMMUNITY): Payer: Self-pay

## 2017-10-08 LAB — HEPATITIS B SURFACE ANTIGEN: Hepatitis B Surface Ag: NEGATIVE

## 2017-10-08 LAB — RENAL FUNCTION PANEL
Albumin: 2.3 g/dL — ABNORMAL LOW (ref 3.5–5.0)
Anion gap: 10 (ref 5–15)
BUN: 27 mg/dL — ABNORMAL HIGH (ref 8–23)
CHLORIDE: 98 mmol/L (ref 98–111)
CO2: 29 mmol/L (ref 22–32)
Calcium: 9.7 mg/dL (ref 8.9–10.3)
Creatinine, Ser: 3.55 mg/dL — ABNORMAL HIGH (ref 0.44–1.00)
GFR calc Af Amer: 13 mL/min — ABNORMAL LOW (ref >60)
GFR calc non Af Amer: 11 mL/min — ABNORMAL LOW (ref >60)
GLUCOSE: 89 mg/dL (ref 70–99)
POTASSIUM: 4.7 mmol/L (ref 3.5–5.1)
Phosphorus: 3.4 mg/dL (ref 2.5–4.6)
Sodium: 137 mmol/L (ref 135–145)

## 2017-10-08 LAB — HEPATITIS B SURFACE ANTIBODY,QUALITATIVE: Hep B S Ab: NONREACTIVE

## 2017-10-08 LAB — MAGNESIUM: Magnesium: 2.1 mg/dL (ref 1.7–2.4)

## 2017-10-08 LAB — HEPATITIS B CORE ANTIBODY, IGM: HEP B C IGM: NEGATIVE

## 2017-10-08 NOTE — Progress Notes (Signed)
[facility]  ROUNDING NOTE   Subjective:  Overall patient remains quite lethargic.  Renal function remains low with a creatinine of 3.5. She did undergo hemodialysis yesterday. We will schedule another dialysis treatment for tomorrow.   Objective:  Vital signs in last 24 hours:  Temperature 90.7 pulse 71 respirations 20 blood pressure 142/77  Physical Exam: General: No acute distress  Head: Normocephalic, atraumatic. Moist oral mucosal membranes  Eyes: Anicteric  Neck: Supple, trachea midline  Lungs:  Diminished at bases, normal effort  Heart: S1S2 no rubs  Abdomen:  Soft, nontender, bowel sounds present  Extremities: 1+ peripheral edema.  Neurologic: Arousable, but not following commands  Skin: Raised lesion on chest wall  Access: Right femoral dialysis catheter    Basic Metabolic Panel: Recent Labs  Lab 10/04/17 0607 10/05/17 0716 10/06/17 0633 10/07/17 0700 10/08/17 0429  NA 136 135 135 135 137  K 3.7 3.7 4.5 4.6 4.7  CL 100 99 97* 96* 98  CO2 28 27 26 27 29   GLUCOSE 88 85 76 70 89  BUN 12 15 22  29* 27*  CREATININE 2.78* 3.30* 3.70* 4.43* 3.55*  CALCIUM 9.0 9.2 9.8 9.7 9.7  MG 2.2 2.0 2.1 2.0 2.1  PHOS  --  4.9* 5.0* 5.0* 3.4    Liver Function Tests: Recent Labs  Lab 10/03/17 0900 10/05/17 0716 10/06/17 0633 10/07/17 0700 10/08/17 0429  AST 22  --   --   --   --   ALT 12  --   --   --   --   ALKPHOS 74  --   --   --   --   BILITOT 0.8  --   --   --   --   PROT 5.4*  --   --   --   --   ALBUMIN 2.2* 2.2* 2.3* 2.1* 2.3*   No results for input(s): LIPASE, AMYLASE in the last 168 hours. No results for input(s): AMMONIA in the last 168 hours.  CBC: Recent Labs  Lab 10/03/17 0900 10/05/17 0716 10/06/17 0633 10/07/17 0700  WBC 13.0* 11.5* 10.0 9.4  NEUTROABS 9.3*  --   --   --   HGB 8.6* 8.2* 8.7* 8.4*  HCT 27.1* 27.6* 29.3* 27.1*  MCV 88.6 93.2 91.6 90.3  PLT 298 319 332 339    Cardiac Enzymes: Recent Labs  Lab  10/04/17 0649 10/04/17 1232 10/04/17 2129  CKTOTAL 29* 40 51  CKMB 3.6 4.1 4.0  TROPONINI 0.05* 0.04* 0.05*    BNP: Invalid input(s): POCBNP  CBG: No results for input(s): GLUCAP in the last 168 hours.  Microbiology: Results for orders placed or performed during the hospital encounter of 10/02/17  Culture, body fluid-bottle     Status: None (Preliminary result)   Collection Time: 10/04/17 12:53 PM  Result Value Ref Range Status   Specimen Description PLEURAL  Final   Special Requests NONE  Final   Culture   Final    NO GROWTH 4 DAYS Performed at [facility] [address], [place], [place] [address]    Report Status PENDING  Incomplete  Gram stain     Status: None   Collection Time: 10/04/17 12:53 PM  Result Value Ref Range Status   Specimen Description PLEURAL  Final   Special Requests NONE  Final   Gram Stain   Final    FEW WBC PRESENT,BOTH PMN AND MONONUCLEAR NO ORGANISMS SEEN Performed at [facility], [address] [place], [place] [address]    Report Status 10/04/2017 FINAL  Final  Gram stain     Status: None   Collection Time: 10/06/17  4:14 PM  Result Value Ref Range Status   Specimen Description PLEURAL LEFT  Final   Special Requests NONE  Final   Gram Stain   Final    ABUNDANT WBC PRESENT, PREDOMINANTLY MONONUCLEAR NO ORGANISMS SEEN Performed at [facility], [place] [address], [place], [place] [address]    Report Status 10/06/2017 FINAL  Final    Coagulation Studies: No results for input(s): LABPROT, INR in the last 72 hours.  Urinalysis: No results for input(s): COLORURINE, LABSPEC, PHURINE, GLUCOSEU, HGBUR, BILIRUBINUR, KETONESUR, PROTEINUR, UROBILINOGEN, NITRITE, LEUKOCYTESUR in the last 72 hours.  Invalid input(s): APPERANCEUR    Imaging: Dg Chest Port 1 View  Result Date: 10/08/2017 CLINICAL DATA:  Pleural effusions EXAM: PORTABLE CHEST 1 VIEW COMPARISON:  October 06, 2017 FINDINGS: Pleural effusions remain with evidence  of loculated effusion on the right. There is cardiomegaly with pulmonary vascular congestion. There is mild interstitial edema. There is atelectatic change in the lung bases. No adenopathy evident. There is aortic atherosclerosis. No bone lesions. IMPRESSION: Pulmonary vascular congestion with pleural effusions and bibasilar atelectasis. Stable mild interstitial edema. No new opacity evident. There is aortic atherosclerosis. Aortic Atherosclerosis (ICD10-I70.0). Electronically Signed   By: Lowella Grip III M.D.   On: 10/08/2017 08:45   Dg Chest Port 1 View  Result Date: 10/06/2017 CLINICAL DATA:  84 y/o F; status post thoracentesis. Pleural effusion on the left. EXAM: PORTABLE CHEST 1 VIEW COMPARISON:  10/06/2017 chest radiograph FINDINGS: Small residual left and stable moderate right pleural effusions. Stable interstitial and alveolar pulmonary edema. Stable cardiomegaly and aortic atherosclerosis. No pneumothorax. Bones are unremarkable. IMPRESSION: Small residual left and stable moderate right pleural effusions. No pneumothorax. Stable pulmonary edema and cardiomegaly. Electronically Signed   By: Kristine Garbe M.D.   On: 10/06/2017 16:47     Medications:       Assessment/ Plan:  84 y.o. female with a PMHx of diastolic heart failure, right-sided pleural effusion with chronic Pleurx catheter since April 2019, paroxysmal atrial fibrillation, hypertension, hyperlipidemia who was originally admitted to [facility] on September 18, 2017 with right-sided chest pain.  She was apparently having issues with her Pleurx catheter.  After admission she underwent bilateral thoracentesis.  Surgery subsequently removed her Pleurx catheter as it was felt that it may possibly be infected.  Patient was also diagnosed with sepsis and hospital-acquired pneumonia. She was initially started on vancomycin and Zosyn.  Unfortunately she underwent ventricular fibrillation arrest on September 25, 2017.  Patient also had dialysis at outside hospital.   1.  Acute renal failure.  2.  Anemia unspecified.  3.  Acute on chronic diastolic heart failure.  4.  Bilateral pleural effusions.  Plan:  overall patient continues to have diminished renal function with a creatinine of 3.5 and EGFR of 11.  Therefore we will plan for another dialysis session tomorrow.  Orders have been prepared.  Continue to monitor renal parameters as well as urine foot over the course of the hospitalization.  Overall prognosis remains guarded however given her multiple comorbidities and generalized debility.     LOS: 0 Dorthea Maina 7/12/20194:26 PM

## 2017-10-09 ENCOUNTER — Other Ambulatory Visit (HOSPITAL_COMMUNITY): Payer: Self-pay

## 2017-10-09 LAB — CBC
HEMATOCRIT: 30.3 % — AB (ref 36.0–46.0)
Hemoglobin: 9.1 g/dL — ABNORMAL LOW (ref 12.0–15.0)
MCH: 27.8 pg (ref 26.0–34.0)
MCHC: 30 g/dL (ref 30.0–36.0)
MCV: 92.7 fL (ref 78.0–100.0)
Platelets: 238 10*3/uL (ref 150–400)
RBC: 3.27 MIL/uL — ABNORMAL LOW (ref 3.87–5.11)
RDW: 17.6 % — AB (ref 11.5–15.5)
WBC: 12.1 10*3/uL — AB (ref 4.0–10.5)

## 2017-10-09 LAB — RENAL FUNCTION PANEL
Albumin: 2.3 g/dL — ABNORMAL LOW (ref 3.5–5.0)
Anion gap: 12 (ref 5–15)
BUN: 30 mg/dL — AB (ref 8–23)
CHLORIDE: 100 mmol/L (ref 98–111)
CO2: 26 mmol/L (ref 22–32)
Calcium: 10 mg/dL (ref 8.9–10.3)
Creatinine, Ser: 3.33 mg/dL — ABNORMAL HIGH (ref 0.44–1.00)
GFR calc Af Amer: 14 mL/min — ABNORMAL LOW (ref >60)
GFR, EST NON AFRICAN AMERICAN: 12 mL/min — AB (ref >60)
Glucose, Bld: 87 mg/dL (ref 70–99)
POTASSIUM: 5 mmol/L (ref 3.5–5.1)
Phosphorus: 3.9 mg/dL (ref 2.5–4.6)
Sodium: 138 mmol/L (ref 135–145)

## 2017-10-09 LAB — MAGNESIUM: MAGNESIUM: 1.9 mg/dL (ref 1.7–2.4)

## 2017-10-09 LAB — LEVETIRACETAM LEVEL: LEVETIRACETAM: 35.6 ug/mL (ref 10.0–40.0)

## 2017-10-09 LAB — CULTURE, BODY FLUID W GRAM STAIN -BOTTLE: Culture: NO GROWTH

## 2017-10-10 LAB — BASIC METABOLIC PANEL
Anion gap: 5 (ref 5–15)
BUN: 21 mg/dL (ref 8–23)
CALCIUM: 10.2 mg/dL (ref 8.9–10.3)
CO2: 32 mmol/L (ref 22–32)
Chloride: 102 mmol/L (ref 98–111)
Creatinine, Ser: 2.35 mg/dL — ABNORMAL HIGH (ref 0.44–1.00)
GFR calc Af Amer: 21 mL/min — ABNORMAL LOW (ref >60)
GFR, EST NON AFRICAN AMERICAN: 18 mL/min — AB (ref >60)
GLUCOSE: 94 mg/dL (ref 70–99)
Potassium: 4.4 mmol/L (ref 3.5–5.1)
Sodium: 139 mmol/L (ref 135–145)

## 2017-10-11 LAB — RENAL FUNCTION PANEL
ANION GAP: 8 (ref 5–15)
Albumin: 2.1 g/dL — ABNORMAL LOW (ref 3.5–5.0)
BUN: 21 mg/dL (ref 8–23)
CALCIUM: 10.8 mg/dL — AB (ref 8.9–10.3)
CHLORIDE: 100 mmol/L (ref 98–111)
CO2: 31 mmol/L (ref 22–32)
Creatinine, Ser: 2.33 mg/dL — ABNORMAL HIGH (ref 0.44–1.00)
GFR, EST AFRICAN AMERICAN: 21 mL/min — AB (ref >60)
GFR, EST NON AFRICAN AMERICAN: 18 mL/min — AB (ref >60)
Glucose, Bld: 81 mg/dL (ref 70–99)
POTASSIUM: 4 mmol/L (ref 3.5–5.1)
Phosphorus: 3.5 mg/dL (ref 2.5–4.6)
Sodium: 139 mmol/L (ref 135–145)

## 2017-10-11 LAB — CULTURE, BODY FLUID W GRAM STAIN -BOTTLE: Culture: NO GROWTH

## 2017-10-11 LAB — CULTURE, BODY FLUID-BOTTLE

## 2017-10-11 LAB — CBC
HEMATOCRIT: 25.1 % — AB (ref 36.0–46.0)
Hemoglobin: 7.6 g/dL — ABNORMAL LOW (ref 12.0–15.0)
MCH: 27.8 pg (ref 26.0–34.0)
MCHC: 30.3 g/dL (ref 30.0–36.0)
MCV: 91.9 fL (ref 78.0–100.0)
Platelets: 202 10*3/uL (ref 150–400)
RBC: 2.73 MIL/uL — AB (ref 3.87–5.11)
RDW: 17.2 % — ABNORMAL HIGH (ref 11.5–15.5)
WBC: 10.5 10*3/uL (ref 4.0–10.5)

## 2017-10-11 LAB — MAGNESIUM: Magnesium: 1.7 mg/dL (ref 1.7–2.4)

## 2017-10-11 NOTE — Progress Notes (Signed)
[facility]  ROUNDING NOTE   Subjective:  Urine output was only 650 cc over the preceding 24 hours. She did complete dialysis on Saturday. Daughter considering comfort care.   Objective:  Vital signs in last 24 hours:  Temperature 98.3 pulse 87 respirations 20 blood pressure 157/64  Physical Exam: General: No acute distress  Head: Normocephalic, atraumatic. Moist oral mucosal membranes  Eyes: Anicteric  Neck: Supple, trachea midline  Lungs:  Diminished at bases, normal effort  Heart: S1S2 no rubs  Abdomen:  Soft, nontender, bowel sounds present  Extremities: 1+ peripheral edema.  Neurologic: Arousable, but not following commands  Skin: Raised lesion on chest wall  Access: Right femoral dialysis catheter    Basic Metabolic Panel: Recent Labs  Lab 10/06/17 0633 10/07/17 0700 10/08/17 0429 10/09/17 0621 10/10/17 0639 10/11/17 0741  NA 135 135 137 138 139 139  K 4.5 4.6 4.7 5.0 4.4 4.0  CL 97* 96* 98 100 102 100  CO2 26 27 29 26  32 31  GLUCOSE 76 70 89 87 94 81  BUN 22 29* 27* 30* 21 21  CREATININE 3.70* 4.43* 3.55* 3.33* 2.35* 2.33*  CALCIUM 9.8 9.7 9.7 10.0 10.2 10.8*  MG 2.1 2.0 2.1 1.9  --  1.7  PHOS 5.0* 5.0* 3.4 3.9  --  3.5    Liver Function Tests: Recent Labs  Lab 10/06/17 0633 10/07/17 0700 10/08/17 0429 10/09/17 0621 10/11/17 0741  ALBUMIN 2.3* 2.1* 2.3* 2.3* 2.1*   No results for input(s): LIPASE, AMYLASE in the last 168 hours. No results for input(s): AMMONIA in the last 168 hours.  CBC: Recent Labs  Lab 10/05/17 0716 10/06/17 0633 10/07/17 0700 10/09/17 0621 10/11/17 0741  WBC 11.5* 10.0 9.4 12.1* 10.5  HGB 8.2* 8.7* 8.4* 9.1* 7.6*  HCT 27.6* 29.3* 27.1* 30.3* 25.1*  MCV 93.2 91.6 90.3 92.7 91.9  PLT 319 332 339 238 202    Cardiac Enzymes: Recent Labs  Lab 10/04/17 1232 10/04/17 2129  CKTOTAL 40 51  CKMB 4.1 4.0  TROPONINI 0.04* 0.05*    BNP: Invalid input(s): POCBNP  CBG: No results for input(s): GLUCAP  in the last 168 hours.  Microbiology: Results for orders placed or performed during the hospital encounter of 10/02/17  Culture, body fluid-bottle     Status: None   Collection Time: 10/04/17 12:53 PM  Result Value Ref Range Status   Specimen Description PLEURAL  Final   Special Requests NONE  Final   Culture   Final    NO GROWTH 5 DAYS Performed at [facility], [address] [place], [place] [address]    Report Status 10/09/2017 FINAL  Final  Gram stain     Status: None   Collection Time: 10/04/17 12:53 PM  Result Value Ref Range Status   Specimen Description PLEURAL  Final   Special Requests NONE  Final   Gram Stain   Final    FEW WBC PRESENT,BOTH PMN AND MONONUCLEAR NO ORGANISMS SEEN Performed at [facility], [address], [place], [place] [address]    Report Status 10/04/2017 FINAL  Final  Culture, body fluid-bottle     Status: None (Preliminary result)   Collection Time: 10/06/17  4:14 PM  Result Value Ref Range Status   Specimen Description PLEURAL LEFT  Final   Special Requests NONE  Final   Culture   Final    NO GROWTH 3 DAYS Performed at [facility], [address], [place],  [place] [id number]    Report Status PENDING  Incomplete  Gram stain     Status: None   Collection Time: 10/06/17  4:14 PM  Result Value Ref Range Status   Specimen Description PLEURAL LEFT  Final   Special Requests NONE  Final   Gram Stain   Final    ABUNDANT WBC PRESENT, PREDOMINANTLY MONONUCLEAR NO ORGANISMS SEEN Performed at [facility], [address] [place], [place] [address]    Report Status 10/06/2017 FINAL  Final    Coagulation Studies: No results for input(s): LABPROT, INR in the last 72 hours.  Urinalysis: No results for input(s): COLORURINE, LABSPEC, PHURINE, GLUCOSEU, HGBUR, BILIRUBINUR, KETONESUR, PROTEINUR, UROBILINOGEN, NITRITE, LEUKOCYTESUR in the last 72 hours.  Invalid input(s): APPERANCEUR    Imaging: No results  found.   Medications:       Assessment/ Plan:  84 y.o. female with a PMHx of diastolic heart failure, right-sided pleural effusion with chronic Pleurx catheter since April 2019, paroxysmal atrial fibrillation, hypertension, hyperlipidemia who was originally [facility] on September 18, 2017 with right-sided chest pain.  She was apparently having issues with her Pleurx catheter.  After admission she underwent bilateral thoracentesis.  Surgery subsequently removed her Pleurx catheter as it was felt that it may possibly be infected.  Patient was also diagnosed with sepsis and hospital-acquired pneumonia. She was initially started on vancomycin and Zosyn.  Unfortunately she underwent ventricular fibrillation arrest on September 25, 2017.  Patient also had dialysis at outside hospital.   1.  Acute renal failure.  2.  Anemia unspecified.  3.  Acute on chronic diastolic heart failure.  4.  Bilateral pleural effusions.  Plan:  Patient continues to have diminished renal function.  Urine output was only 650 cc over the preceding 24 hours.  Therefore we will plan for dialysis again on Tuesday.  Overall prognosis remains quite guarded given her age and multiple comorbidities and recent medical events.  Family considering comfort care which would certainly be appropriate.     LOS: 0 Cayetano Mikita 7/15/20198:59 AM

## 2017-10-12 LAB — CBC
HEMATOCRIT: 24.6 % — AB (ref 36.0–46.0)
HEMOGLOBIN: 7.4 g/dL — AB (ref 12.0–15.0)
MCH: 27.5 pg (ref 26.0–34.0)
MCHC: 30.1 g/dL (ref 30.0–36.0)
MCV: 91.4 fL (ref 78.0–100.0)
Platelets: 219 10*3/uL (ref 150–400)
RBC: 2.69 MIL/uL — ABNORMAL LOW (ref 3.87–5.11)
RDW: 17 % — AB (ref 11.5–15.5)
WBC: 9.5 10*3/uL (ref 4.0–10.5)

## 2017-10-12 LAB — RENAL FUNCTION PANEL
ALBUMIN: 2.1 g/dL — AB (ref 3.5–5.0)
ANION GAP: 7 (ref 5–15)
BUN: 22 mg/dL (ref 8–23)
CHLORIDE: 100 mmol/L (ref 98–111)
CO2: 31 mmol/L (ref 22–32)
Calcium: 10.4 mg/dL — ABNORMAL HIGH (ref 8.9–10.3)
Creatinine, Ser: 2.16 mg/dL — ABNORMAL HIGH (ref 0.44–1.00)
GFR calc Af Amer: 23 mL/min — ABNORMAL LOW (ref >60)
GFR, EST NON AFRICAN AMERICAN: 20 mL/min — AB (ref >60)
Glucose, Bld: 98 mg/dL (ref 70–99)
PHOSPHORUS: 3.7 mg/dL (ref 2.5–4.6)
POTASSIUM: 3.7 mmol/L (ref 3.5–5.1)
Sodium: 138 mmol/L (ref 135–145)

## 2017-10-12 LAB — MAGNESIUM: MAGNESIUM: 2.2 mg/dL (ref 1.7–2.4)

## 2017-10-13 LAB — BASIC METABOLIC PANEL
Anion gap: 8 (ref 5–15)
BUN: 12 mg/dL (ref 8–23)
CHLORIDE: 100 mmol/L (ref 98–111)
CO2: 30 mmol/L (ref 22–32)
CREATININE: 1.53 mg/dL — AB (ref 0.44–1.00)
Calcium: 9.6 mg/dL (ref 8.9–10.3)
GFR calc non Af Amer: 30 mL/min — ABNORMAL LOW (ref >60)
GFR, EST AFRICAN AMERICAN: 35 mL/min — AB (ref >60)
Glucose, Bld: 76 mg/dL (ref 70–99)
Potassium: 3.6 mmol/L (ref 3.5–5.1)
Sodium: 138 mmol/L (ref 135–145)

## 2017-10-13 NOTE — Progress Notes (Signed)
[facility]  ROUNDING NOTE   Subjective:  Thus far this a.m. The patient has had 200 cc of urine output. Creatinine down to 1.5 under the influence of hemodialysis.Still no significant change in patient status however.   Objective:  Vital signs in last 24 hours:  Temperature 97.4 pulse 81 respirations 27 blood pressure 149/62  Physical Exam: General: No acute distress  Head: Normocephalic, atraumatic. Moist oral mucosal membranes  Eyes: Anicteric  Neck: Supple, trachea midline  Lungs:  Diminished at bases, normal effort  Heart: S1S2 no rubs  Abdomen:  Soft, nontender, bowel sounds present  Extremities: 1+ peripheral edema.  Neurologic: Arousable, but not following commands  Skin: Raised lesion on chest wall  Access: Right femoral dialysis catheter    Basic Metabolic Panel: Recent Labs  Lab 10/07/17 0700 10/08/17 0429 10/09/17 0621 10/10/17 0639 10/11/17 0741 10/12/17 0721 10/13/17 0531  NA 135 137 138 139 139 138 138  K 4.6 4.7 5.0 4.4 4.0 3.7 3.6  CL 96* 98 100 102 100 100 100  CO2 27 29 26  32 31 31 30   GLUCOSE 70 89 87 94 81 98 76  BUN 29* 27* 30* 21 21 22 12   CREATININE 4.43* 3.55* 3.33* 2.35* 2.33* 2.16* 1.53*  CALCIUM 9.7 9.7 10.0 10.2 10.8* 10.4* 9.6  MG 2.0 2.1 1.9  --  1.7 2.2  --   PHOS 5.0* 3.4 3.9  --  3.5 3.7  --     Liver Function Tests: Recent Labs  Lab 10/07/17 0700 10/08/17 0429 10/09/17 0621 10/11/17 0741 10/12/17 0721  ALBUMIN 2.1* 2.3* 2.3* 2.1* 2.1*   No results for input(s): LIPASE, AMYLASE in the last 168 hours. No results for input(s): AMMONIA in the last 168 hours.  CBC: Recent Labs  Lab 10/07/17 0700 10/09/17 0621 10/11/17 0741 10/12/17 0721  WBC 9.4 12.1* 10.5 9.5  HGB 8.4* 9.1* 7.6* 7.4*  HCT 27.1* 30.3* 25.1* 24.6*  MCV 90.3 92.7 91.9 91.4  PLT 339 238 202 219    Cardiac Enzymes: No results for input(s): CKTOTAL, CKMB, CKMBINDEX, TROPONINI in the last 168 hours.  BNP: Invalid input(s):  POCBNP  CBG: No results for input(s): GLUCAP in the last 168 hours.  Microbiology: Results for orders placed or performed during the hospital encounter of 10/02/17  Culture, body fluid-bottle     Status: None   Collection Time: 10/04/17 12:53 PM  Result Value Ref Range Status   Specimen Description PLEURAL  Final   Special Requests NONE  Final   Culture   Final    NO GROWTH 5 DAYS Performed at [facility], [address] [place], [place] [address]    Report Status 10/09/2017 FINAL  Final  Gram stain     Status: None   Collection Time: 10/04/17 12:53 PM  Result Value Ref Range Status   Specimen Description PLEURAL  Final   Special Requests NONE  Final   Gram Stain   Final    FEW WBC PRESENT,BOTH PMN AND MONONUCLEAR NO ORGANISMS SEEN Performed at [facility], [address] [place], [place] [address]    Report Status 10/04/2017 FINAL  Final  Culture, body fluid-bottle     Status: None   Collection Time: 10/06/17  4:14 PM  Result Value Ref Range Status   Specimen Description PLEURAL LEFT  Final   Special Requests NONE  Final   Culture   Final    NO GROWTH 5 DAYS Performed at [facility]  Lab, [address] [place], [place] [address]    Report Status 10/11/2017 FINAL  Final  Gram stain     Status: None   Collection Time: 10/06/17  4:14 PM  Result Value Ref Range Status   Specimen Description PLEURAL LEFT  Final   Special Requests NONE  Final   Gram Stain   Final    ABUNDANT WBC PRESENT, PREDOMINANTLY MONONUCLEAR NO ORGANISMS SEEN Performed at [facility], [address] [place], [place] [address]    Report Status 10/06/2017 FINAL  Final    Coagulation Studies: No results for input(s): LABPROT, INR in the last 72 hours.  Urinalysis: No results for input(s): COLORURINE, LABSPEC, PHURINE, GLUCOSEU, HGBUR, BILIRUBINUR, KETONESUR, PROTEINUR, UROBILINOGEN, NITRITE, LEUKOCYTESUR in the last 72 hours.  Invalid input(s): APPERANCEUR     Imaging: No results found.   Medications:       Assessment/ Plan:  84 y.o. female with a PMHx of diastolic heart failure, right-sided pleural effusion with chronic Pleurx catheter since April 2019, paroxysmal atrial fibrillation, hypertension, hyperlipidemia who was originally admitted to [facility] on September 18, 2017 with right-sided chest pain.  She was apparently having issues with her Pleurx catheter.  After admission she underwent bilateral thoracentesis.  Surgery subsequently removed her Pleurx catheter as it was felt that it may possibly be infected.  Patient was also diagnosed with sepsis and hospital-acquired pneumonia. She was initially started on vancomycin and Zosyn.  Unfortunately she underwent ventricular fibrillation arrest on September 25, 2017.  Patient also had dialysis at outside hospital.   1.  Acute renal failure.  2.  Anemia unspecified.  3.  Acute on chronic diastolic heart failure.  4.  Bilateral pleural effusions.  Plan:  we will plan for an additional dialysis treatment on Thursday.  Creatinine has trended down with dialysis treatments.  Urine output was 200 cc since 7 AM today.  The patient's son and daughter do not have consensus in terms of direction of care. Care management may need to assist with this.  Otherwise continue supportive care.  Prognosis guarded.     LOS: 0 Morgan Sosa 7/17/201911:23 AM

## 2017-10-14 ENCOUNTER — Other Ambulatory Visit (HOSPITAL_COMMUNITY): Payer: Self-pay

## 2017-10-14 LAB — RENAL FUNCTION PANEL
ANION GAP: 8 (ref 5–15)
Albumin: 2.3 g/dL — ABNORMAL LOW (ref 3.5–5.0)
BUN: 11 mg/dL (ref 8–23)
CHLORIDE: 99 mmol/L (ref 98–111)
CO2: 31 mmol/L (ref 22–32)
Calcium: 9.7 mg/dL (ref 8.9–10.3)
Creatinine, Ser: 1.66 mg/dL — ABNORMAL HIGH (ref 0.44–1.00)
GFR calc non Af Amer: 27 mL/min — ABNORMAL LOW (ref >60)
GFR, EST AFRICAN AMERICAN: 31 mL/min — AB (ref >60)
Glucose, Bld: 192 mg/dL — ABNORMAL HIGH (ref 70–99)
Phosphorus: 2.6 mg/dL (ref 2.5–4.6)
Potassium: 3.1 mmol/L — ABNORMAL LOW (ref 3.5–5.1)
Sodium: 138 mmol/L (ref 135–145)

## 2017-10-14 LAB — CBC
HCT: 24.8 % — ABNORMAL LOW (ref 36.0–46.0)
HEMOGLOBIN: 7.5 g/dL — AB (ref 12.0–15.0)
MCH: 27.9 pg (ref 26.0–34.0)
MCHC: 30.2 g/dL (ref 30.0–36.0)
MCV: 92.2 fL (ref 78.0–100.0)
PLATELETS: 203 10*3/uL (ref 150–400)
RBC: 2.69 MIL/uL — ABNORMAL LOW (ref 3.87–5.11)
RDW: 17 % — ABNORMAL HIGH (ref 11.5–15.5)
WBC: 8.2 10*3/uL (ref 4.0–10.5)

## 2017-10-15 ENCOUNTER — Other Ambulatory Visit (HOSPITAL_COMMUNITY): Payer: Self-pay

## 2017-10-15 LAB — CBC WITH DIFFERENTIAL/PLATELET
ABS IMMATURE GRANULOCYTES: 0.1 10*3/uL (ref 0.0–0.1)
BASOS ABS: 0 10*3/uL (ref 0.0–0.1)
Basophils Relative: 0 %
EOS PCT: 5 %
Eosinophils Absolute: 0.5 10*3/uL (ref 0.0–0.7)
HEMATOCRIT: 24 % — AB (ref 36.0–46.0)
HEMOGLOBIN: 7.3 g/dL — AB (ref 12.0–15.0)
Immature Granulocytes: 1 %
LYMPHS ABS: 0.8 10*3/uL (ref 0.7–4.0)
LYMPHS PCT: 9 %
MCH: 28 pg (ref 26.0–34.0)
MCHC: 30.4 g/dL (ref 30.0–36.0)
MCV: 92 fL (ref 78.0–100.0)
Monocytes Absolute: 1.3 10*3/uL — ABNORMAL HIGH (ref 0.1–1.0)
Monocytes Relative: 13 %
NEUTROS ABS: 7.1 10*3/uL (ref 1.7–7.7)
Neutrophils Relative %: 72 %
Platelets: 214 10*3/uL (ref 150–400)
RBC: 2.61 MIL/uL — AB (ref 3.87–5.11)
RDW: 16.9 % — ABNORMAL HIGH (ref 11.5–15.5)
WBC: 9.8 10*3/uL (ref 4.0–10.5)

## 2017-10-15 LAB — AMMONIA: Ammonia: 34 umol/L (ref 9–35)

## 2017-10-15 LAB — BLOOD GAS, ARTERIAL
Acid-Base Excess: 7.7 mmol/L — ABNORMAL HIGH (ref 0.0–2.0)
Bicarbonate: 32.4 mmol/L — ABNORMAL HIGH (ref 20.0–28.0)
O2 CONTENT: 3 L/min
O2 SAT: 91.5 %
PCO2 ART: 52.3 mmHg — AB (ref 32.0–48.0)
PO2 ART: 60.8 mmHg — AB (ref 83.0–108.0)
Patient temperature: 98.6
pH, Arterial: 7.409 (ref 7.350–7.450)

## 2017-10-15 LAB — BASIC METABOLIC PANEL
Anion gap: 8 (ref 5–15)
BUN: 8 mg/dL (ref 8–23)
CO2: 32 mmol/L (ref 22–32)
Calcium: 8.7 mg/dL — ABNORMAL LOW (ref 8.9–10.3)
Chloride: 95 mmol/L — ABNORMAL LOW (ref 98–111)
Creatinine, Ser: 1.25 mg/dL — ABNORMAL HIGH (ref 0.44–1.00)
GFR calc Af Amer: 44 mL/min — ABNORMAL LOW (ref >60)
GFR calc non Af Amer: 38 mL/min — ABNORMAL LOW (ref >60)
Glucose, Bld: 100 mg/dL — ABNORMAL HIGH (ref 70–99)
Potassium: 3.5 mmol/L (ref 3.5–5.1)
Sodium: 135 mmol/L (ref 135–145)

## 2017-10-15 LAB — COMPREHENSIVE METABOLIC PANEL
ALBUMIN: 2.3 g/dL — AB (ref 3.5–5.0)
ALK PHOS: 112 U/L (ref 38–126)
ALT: 10 U/L (ref 0–44)
AST: 21 U/L (ref 15–41)
Anion gap: 6 (ref 5–15)
BILIRUBIN TOTAL: 0.4 mg/dL (ref 0.3–1.2)
BUN: 7 mg/dL — ABNORMAL LOW (ref 8–23)
CALCIUM: 8.9 mg/dL (ref 8.9–10.3)
CO2: 33 mmol/L — AB (ref 22–32)
Chloride: 95 mmol/L — ABNORMAL LOW (ref 98–111)
Creatinine, Ser: 1.28 mg/dL — ABNORMAL HIGH (ref 0.44–1.00)
GFR calc Af Amer: 43 mL/min — ABNORMAL LOW (ref >60)
GFR calc non Af Amer: 37 mL/min — ABNORMAL LOW (ref >60)
GLUCOSE: 123 mg/dL — AB (ref 70–99)
Potassium: 3.5 mmol/L (ref 3.5–5.1)
SODIUM: 134 mmol/L — AB (ref 135–145)
TOTAL PROTEIN: 5.9 g/dL — AB (ref 6.5–8.1)

## 2017-10-15 LAB — MAGNESIUM: Magnesium: 1.7 mg/dL (ref 1.7–2.4)

## 2017-10-15 LAB — T4, FREE: Free T4: 1.03 ng/dL (ref 0.82–1.77)

## 2017-10-15 LAB — TSH: TSH: 18.175 u[IU]/mL — AB (ref 0.350–4.500)

## 2017-10-15 NOTE — Progress Notes (Signed)
[facility]  ROUNDING NOTE   Subjective:  Good urine output noted this a.m. Proximately 700 cc of urine in the bag. Creatinine has come down to 1.2.   Objective:  Vital signs in last 24 hours:  Temperature 98.4 pulse 70 respirations 21 blood pressure 151/91  Physical Exam: General: No acute distress  Head: Normocephalic, atraumatic. Moist oral mucosal membranes  Eyes: Anicteric  Neck: Supple, trachea midline  Lungs:  Diminished at bases, normal effort  Heart: S1S2 no rubs  Abdomen:  Soft, nontender, bowel sounds present  Extremities: 1+ peripheral edema.  Neurologic: Awake, interactive today  Skin: Raised lesion on chest wall  Access: Right femoral dialysis catheter    Basic Metabolic Panel: Recent Labs  Lab 10/09/17 0621  10/11/17 0741 10/12/17 0721 10/13/17 0531 10/14/17 0520 10/15/17 0618  NA 138   < > 139 138 138 138 135  K 5.0   < > 4.0 3.7 3.6 3.1* 3.5  CL 100   < > 100 100 100 99 95*  CO2 26   < > 31 31 30 31  32  GLUCOSE 87   < > 81 98 76 192* 100*  BUN 30*   < > 21 22 12 11 8   CREATININE 3.33*   < > 2.33* 2.16* 1.53* 1.66* 1.25*  CALCIUM 10.0   < > 10.8* 10.4* 9.6 9.7 8.7*  MG 1.9  --  1.7 2.2  --   --  1.7  PHOS 3.9  --  3.5 3.7  --  2.6  --    < > = values in this interval not displayed.    Liver Function Tests: Recent Labs  Lab 10/09/17 0621 10/11/17 0741 10/12/17 0721 10/14/17 0520  ALBUMIN 2.3* 2.1* 2.1* 2.3*   No results for input(s): LIPASE, AMYLASE in the last 168 hours. No results for input(s): AMMONIA in the last 168 hours.  CBC: Recent Labs  Lab 10/09/17 0621 10/11/17 0741 10/12/17 0721 10/14/17 0520  WBC 12.1* 10.5 9.5 8.2  HGB 9.1* 7.6* 7.4* 7.5*  HCT 30.3* 25.1* 24.6* 24.8*  MCV 92.7 91.9 91.4 92.2  PLT 238 202 219 203    Cardiac Enzymes: No results for input(s): CKTOTAL, CKMB, CKMBINDEX, TROPONINI in the last 168 hours.  BNP: Invalid input(s): POCBNP  CBG: No results for input(s): GLUCAP in the last  168 hours.  Microbiology: Results for orders placed or performed during the hospital encounter of 10/02/17  Culture, body fluid-bottle     Status: None   Collection Time: 10/04/17 12:53 PM  Result Value Ref Range Status   Specimen Description PLEURAL  Final   Special Requests NONE  Final   Culture   Final    NO GROWTH 5 DAYS Performed at [facility], [address] [place], [place] [address]    Report Status 10/09/2017 FINAL  Final  Gram stain     Status: None   Collection Time: 10/04/17 12:53 PM  Result Value Ref Range Status   Specimen Description PLEURAL  Final   Special Requests NONE  Final   Gram Stain   Final    FEW WBC PRESENT,BOTH PMN AND MONONUCLEAR NO ORGANISMS SEEN Performed at [facility], [address] [place], [place] [address]    Report Status 10/04/2017 FINAL  Final  Culture, body fluid-bottle     Status: None   Collection Time: 10/06/17  4:14 PM  Result Value Ref Range Status   Specimen Description PLEURAL LEFT  Final  Special Requests NONE  Final   Culture   Final    NO GROWTH 5 DAYS Performed at [facility], [address], [place], [place] [address]    Report Status 10/11/2017 FINAL  Final  Gram stain     Status: None   Collection Time: 10/06/17  4:14 PM  Result Value Ref Range Status   Specimen Description PLEURAL LEFT  Final   Special Requests NONE  Final   Gram Stain   Final    ABUNDANT WBC PRESENT, PREDOMINANTLY MONONUCLEAR NO ORGANISMS SEEN Performed at [facility], [address], [place], [place] [address]    Report Status 10/06/2017 FINAL  Final    Coagulation Studies: No results for input(s): LABPROT, INR in the last 72 hours.  Urinalysis: No results for input(s): COLORURINE, LABSPEC, PHURINE, GLUCOSEU, HGBUR, BILIRUBINUR, KETONESUR, PROTEINUR, UROBILINOGEN, NITRITE, LEUKOCYTESUR in the last 72 hours.  Invalid input(s): APPERANCEUR    Imaging: No results found.   Medications:        Assessment/ Plan:  84 y.o. female with a PMHx of diastolic heart failure, right-sided pleural effusion with chronic Pleurx catheter since April 2019, paroxysmal atrial fibrillation, hypertension, hyperlipidemia who was originally admitted to [facility] on September 18, 2017 with right-sided chest pain.  She was apparently having issues with her Pleurx catheter.  After admission she underwent bilateral thoracentesis.  Surgery subsequently removed her Pleurx catheter as it was felt that it may possibly be infected.  Patient was also diagnosed with sepsis and hospital-acquired pneumonia. She was initially started on vancomycin and Zosyn.  Unfortunately she underwent ventricular fibrillation arrest on September 25, 2017.  Patient also had dialysis at outside hospital.   1.  Acute renal failure.  2.  Anemia unspecified.  3.  Acute on chronic diastolic heart failure.  4.  Bilateral pleural effusions.  Plan:  Renal function has improved.  Creatinine down to 1.25.  Urine output was 950 cc over the receiving 24 hours.  Therefore no additional indication for dialysis at the moment.  We will continue to monitor her progress closely.  Continue to monitor hemoglobin as it is currently 7.5.  Consider transfusion for hemoglobin of 7 or less.     LOS: 0 Morgan Sosa 7/19/20198:49 AM

## 2017-10-16 ENCOUNTER — Other Ambulatory Visit (HOSPITAL_COMMUNITY): Payer: Self-pay

## 2017-10-16 LAB — T4, FREE: FREE T4: 0.91 ng/dL (ref 0.82–1.77)

## 2017-10-16 LAB — PROTEIN, PLEURAL OR PERITONEAL FLUID

## 2017-10-16 LAB — GLUCOSE, PLEURAL OR PERITONEAL FLUID: GLUCOSE FL: 104 mg/dL

## 2017-10-16 LAB — GRAM STAIN

## 2017-10-16 LAB — BASIC METABOLIC PANEL
ANION GAP: 8 (ref 5–15)
BUN: 6 mg/dL — ABNORMAL LOW (ref 8–23)
CHLORIDE: 95 mmol/L — AB (ref 98–111)
CO2: 31 mmol/L (ref 22–32)
Calcium: 8.8 mg/dL — ABNORMAL LOW (ref 8.9–10.3)
Creatinine, Ser: 1.28 mg/dL — ABNORMAL HIGH (ref 0.44–1.00)
GFR calc non Af Amer: 37 mL/min — ABNORMAL LOW (ref >60)
GFR, EST AFRICAN AMERICAN: 43 mL/min — AB (ref >60)
GLUCOSE: 102 mg/dL — AB (ref 70–99)
POTASSIUM: 3.2 mmol/L — AB (ref 3.5–5.1)
Sodium: 134 mmol/L — ABNORMAL LOW (ref 135–145)

## 2017-10-16 LAB — MAGNESIUM: Magnesium: 2.1 mg/dL (ref 1.7–2.4)

## 2017-10-16 LAB — TSH: TSH: 15.956 u[IU]/mL — AB (ref 0.350–4.500)

## 2017-10-16 MED ORDER — LIDOCAINE HCL (PF) 1 % IJ SOLN
INTRAMUSCULAR | Status: AC
  Filled 2017-10-16: qty 10

## 2017-10-16 NOTE — Procedures (Signed)
   [place] Portable Thoracentesis  Left pleural effusion 500 cc yellow fluid Met resistance-- stopped flowing  Labs sent per MD  cxr pending

## 2017-10-17 LAB — BASIC METABOLIC PANEL
ANION GAP: 7 (ref 5–15)
BUN: 9 mg/dL (ref 8–23)
CALCIUM: 9 mg/dL (ref 8.9–10.3)
CHLORIDE: 95 mmol/L — AB (ref 98–111)
CO2: 31 mmol/L (ref 22–32)
Creatinine, Ser: 1.41 mg/dL — ABNORMAL HIGH (ref 0.44–1.00)
GFR calc Af Amer: 38 mL/min — ABNORMAL LOW (ref >60)
GFR calc non Af Amer: 33 mL/min — ABNORMAL LOW (ref >60)
GLUCOSE: 96 mg/dL (ref 70–99)
POTASSIUM: 3.8 mmol/L (ref 3.5–5.1)
Sodium: 133 mmol/L — ABNORMAL LOW (ref 135–145)

## 2017-10-17 LAB — PH, BODY FLUID: PH, BODY FLUID: 7.5

## 2017-10-18 ENCOUNTER — Other Ambulatory Visit (HOSPITAL_COMMUNITY): Payer: Self-pay

## 2017-10-18 LAB — CBC
HCT: 23.3 % — ABNORMAL LOW (ref 36.0–46.0)
HEMOGLOBIN: 7.3 g/dL — AB (ref 12.0–15.0)
MCH: 28.7 pg (ref 26.0–34.0)
MCHC: 31.3 g/dL (ref 30.0–36.0)
MCV: 91.7 fL (ref 78.0–100.0)
PLATELETS: 280 10*3/uL (ref 150–400)
RBC: 2.54 MIL/uL — AB (ref 3.87–5.11)
RDW: 17.1 % — ABNORMAL HIGH (ref 11.5–15.5)
WBC: 12 10*3/uL — AB (ref 4.0–10.5)

## 2017-10-18 LAB — BASIC METABOLIC PANEL
ANION GAP: 7 (ref 5–15)
BUN: 15 mg/dL (ref 8–23)
CHLORIDE: 97 mmol/L — AB (ref 98–111)
CO2: 33 mmol/L — ABNORMAL HIGH (ref 22–32)
CREATININE: 1.36 mg/dL — AB (ref 0.44–1.00)
Calcium: 9.9 mg/dL (ref 8.9–10.3)
GFR calc non Af Amer: 34 mL/min — ABNORMAL LOW (ref >60)
GFR, EST AFRICAN AMERICAN: 40 mL/min — AB (ref >60)
Glucose, Bld: 110 mg/dL — ABNORMAL HIGH (ref 70–99)
POTASSIUM: 3.6 mmol/L (ref 3.5–5.1)
SODIUM: 137 mmol/L (ref 135–145)

## 2017-10-18 NOTE — Progress Notes (Signed)
[facility]  ROUNDING NOTE   Subjective:  Patient seen at bedside. Urine output was 1800 cc over the preceding 24 hours. Creatinine yesterday was 1.4.   Objective:  Vital signs in last 24 hours:  Temperature 97.1 pulse 84 respirations 20 blood pressure 158/67  Physical Exam: General: No acute distress  Head: Normocephalic, atraumatic. Moist oral mucosal membranes  Eyes: Anicteric  Neck: Supple, trachea midline  Lungs:  Diminished at bases, normal effort  Heart: S1S2 no rubs  Abdomen:  Soft, nontender, bowel sounds present  Extremities: 1+ peripheral edema.  Neurologic: Awake, alert  Skin: Raised lesion on chest wall  Access: Right femoral dialysis catheter    Basic Metabolic Panel: Recent Labs  Lab 10/12/17 0721  10/14/17 0520 10/15/17 0618 10/15/17 1705 10/16/17 0705 10/17/17 0731  NA 138   < > 138 135 134* 134* 133*  K 3.7   < > 3.1* 3.5 3.5 3.2* 3.8  CL 100   < > 99 95* 95* 95* 95*  CO2 31   < > 31 32 33* 31 31  GLUCOSE 98   < > 192* 100* 123* 102* 96  BUN 22   < > 11 8 7* 6* 9  CREATININE 2.16*   < > 1.66* 1.25* 1.28* 1.28* 1.41*  CALCIUM 10.4*   < > 9.7 8.7* 8.9 8.8* 9.0  MG 2.2  --   --  1.7  --  2.1  --   PHOS 3.7  --  2.6  --   --   --   --    < > = values in this interval not displayed.    Liver Function Tests: Recent Labs  Lab 10/12/17 0721 10/14/17 0520 10/15/17 1705  AST  --   --  21  ALT  --   --  10  ALKPHOS  --   --  112  BILITOT  --   --  0.4  PROT  --   --  5.9*  ALBUMIN 2.1* 2.3* 2.3*   No results for input(s): LIPASE, AMYLASE in the last 168 hours. Recent Labs  Lab 10/15/17 1705  AMMONIA 34    CBC: Recent Labs  Lab 10/12/17 0721 10/14/17 0520 10/15/17 1705  WBC 9.5 8.2 9.8  NEUTROABS  --   --  7.1  HGB 7.4* 7.5* 7.3*  HCT 24.6* 24.8* 24.0*  MCV 91.4 92.2 92.0  PLT 219 203 214    Cardiac Enzymes: No results for input(s): CKTOTAL, CKMB, CKMBINDEX, TROPONINI in the last 168 hours.  BNP: Invalid  input(s): POCBNP  CBG: No results for input(s): GLUCAP in the last 168 hours.  Microbiology: Results for orders placed or performed during the hospital encounter of 10/02/17  Culture, body fluid-bottle     Status: None   Collection Time: 10/04/17 12:53 PM  Result Value Ref Range Status   Specimen Description PLEURAL  Final   Special Requests NONE  Final   Culture   Final    NO GROWTH 5 DAYS Performed at [facility], [address] [place], [place] [address]    Report Status 10/09/2017 FINAL  Final  Gram stain     Status: None   Collection Time: 10/04/17 12:53 PM  Result Value Ref Range Status   Specimen Description PLEURAL  Final   Special Requests NONE  Final   Gram Stain   Final    FEW WBC PRESENT,BOTH PMN AND MONONUCLEAR NO ORGANISMS SEEN Performed at [facility], [address] [place], [place] [address]    Report Status 10/04/2017 FINAL  Final  Culture, body fluid-bottle     Status: None   Collection Time: 10/06/17  4:14 PM  Result Value Ref Range Status   Specimen Description PLEURAL LEFT  Final   Special Requests NONE  Final   Culture   Final    NO GROWTH 5 DAYS Performed at [facility], [address], [place], [place] [address]    Report Status 10/11/2017 FINAL  Final  Gram stain     Status: None   Collection Time: 10/06/17  4:14 PM  Result Value Ref Range Status   Specimen Description PLEURAL LEFT  Final   Special Requests NONE  Final   Gram Stain   Final    ABUNDANT WBC PRESENT, PREDOMINANTLY MONONUCLEAR NO ORGANISMS SEEN Performed at [facility], [address] [place], [place] [address]    Report Status 10/06/2017 FINAL  Final  Culture, body fluid-bottle     Status: None (Preliminary result)   Collection Time: 10/16/17 11:00 AM  Result Value Ref Range Status   Specimen Description PLEURAL  Final   Special Requests FLUID  Final   Culture   Final    NO GROWTH 1 DAY Performed at [facility], [address],  [place], [place] [address]    Report Status PENDING  Incomplete  Gram stain     Status: None   Collection Time: 10/16/17 11:00 AM  Result Value Ref Range Status   Specimen Description PLEURAL  Final   Special Requests FLUID  Final   Gram Stain   Final    WBC PRESENT,BOTH PMN AND MONONUCLEAR NO ORGANISMS SEEN CYTOSPIN SMEAR Performed at [facility], [address] [place], [place] [address]    Report Status 10/16/2017 FINAL  Final    Coagulation Studies: No results for input(s): LABPROT, INR in the last 72 hours.  Urinalysis: No results for input(s): COLORURINE, LABSPEC, PHURINE, GLUCOSEU, HGBUR, BILIRUBINUR, KETONESUR, PROTEINUR, UROBILINOGEN, NITRITE, LEUKOCYTESUR in the last 72 hours.  Invalid input(s): APPERANCEUR    Imaging: Dg Chest Port 1 View  Result Date: 10/16/2017 CLINICAL DATA:  Pleural effusion.  Status post left thoracentesis. EXAM: PORTABLE CHEST 1 VIEW COMPARISON:  Chest x-ray from yesterday. FINDINGS: Stable cardiomegaly. Unchanged pulmonary vascular congestion and interstitial edema. Relatively unchanged moderate bilateral pleural effusions with associated atelectasis. No pneumothorax. No acute osseous abnormality. IMPRESSION: 1. Relatively unchanged moderate bilateral pleural effusions. No pneumothorax. 2. Unchanged vascular congestion and interstitial edema. Electronically Signed   By: Obie Dredge M.D.   On: 10/16/2017 11:20   Dg Abd Portable 1v  Result Date: 10/16/2017 CLINICAL DATA:  Encounter for nasogastric (NG) tube placement EXAM: PORTABLE ABDOMEN - 1 VIEW COMPARISON:  None. FINDINGS: NG tube with tip in the gastric body. Styloid remains. Tip directed towards the pylorus. IMPRESSION: Feeding tube with tip in the gastric body/antrum Electronically Signed   By: Genevive Bi M.D.   On: 10/16/2017 19:26   US Thoracentesis Asp Pleural Space W/img Guide  Result Date: 10/16/2017 INDICATION: Symptomatic left sided pleural effusion EXAM: US THORACENTESIS  ASP PLEURAL SPACE W/IMG GUIDE PORTABLE COMPARISON:  Previous thoracentesis. MEDICATIONS: 10 cc 1% lidocaine. COMPLICATIONS: None immediate. TECHNIQUE: Informed written consent was obtained from the patient after a discussion of the risks, benefits and alternatives to treatment. A timeout was performed prior to the initiation of the procedure. Initial ultrasound scanning demonstrates a left pleural effusion. The lower  chest was prepped and draped in the usual sterile fashion. 1% lidocaine was used for local anesthesia. Under direct ultrasound guidance, a 19 gauge, 7-cm, Yueh catheter was introduced. An ultrasound image was saved for documentation purposes. The thoracentesis was performed. The catheter was removed and a dressing was applied. The patient tolerated the procedure well without immediate post procedural complication. The patient was escorted to have an upright chest radiograph. FINDINGS: A total of approximately 500 cc of serous fluid was removed. Requested samples were sent to the laboratory. No Pneumothorax post procedure IMPRESSION: Successful ultrasound-guided left sided thoracentesis yielding 500 cc of pleural fluid. Read by Robet Leu [facility] Electronically Signed   By: Judie Petit.  Shick M.D.   On: 10/16/2017 11:25     Medications:       Assessment/ Plan:  84 y.o. female with a PMHx of diastolic heart failure, right-sided pleural effusion with chronic Pleurx catheter since April 2019, paroxysmal atrial fibrillation, hypertension, hyperlipidemia who was originally admitted to [facility] on September 18, 2017 with right-sided chest pain.  She was apparently having issues with her Pleurx catheter.  After admission she underwent bilateral thoracentesis.  Surgery subsequently removed her Pleurx catheter as it was felt that it may possibly be infected.  Patient was also diagnosed with sepsis and hospital-acquired pneumonia. She was initially started on vancomycin and Zosyn.   Unfortunately she underwent ventricular fibrillation arrest on September 25, 2017.  Patient also had dialysis at outside hospital.   1.  Acute renal failure.  2.  Anemia unspecified.  3.  Acute on chronic diastolic heart failure.  4.  Bilateral pleural effusions.  Plan:  Patient significantly improved.  Creatinine currently 1.4.  Urine output 1.8 L over the preceding 24 hours.  We will plan to remove temporary dialysis catheter.  No additional need for dialysis at the moment.  Still quite bit debilitated however.  Continue supportive care as per hospitalist.     LOS: 0 Morgan Sosa 7/22/20198:24 AM

## 2017-10-19 ENCOUNTER — Other Ambulatory Visit (HOSPITAL_COMMUNITY): Payer: Self-pay

## 2017-10-19 LAB — CBC
HCT: 18.2 % — ABNORMAL LOW (ref 36.0–46.0)
HCT: 26.4 % — ABNORMAL LOW (ref 36.0–46.0)
HEMOGLOBIN: 8.5 g/dL — AB (ref 12.0–15.0)
Hemoglobin: 5.5 g/dL — CL (ref 12.0–15.0)
MCH: 28.2 pg (ref 26.0–34.0)
MCH: 28.1 pg (ref 26.0–34.0)
MCHC: 30.2 g/dL (ref 30.0–36.0)
MCHC: 32.2 g/dL (ref 30.0–36.0)
MCV: 93.3 fL (ref 78.0–100.0)
MCV: 87.1 fL (ref 78.0–100.0)
PLATELETS: 309 10*3/uL (ref 150–400)
Platelets: 306 10*3/uL (ref 150–400)
RBC: 1.95 MIL/uL — ABNORMAL LOW (ref 3.87–5.11)
RBC: 3.03 MIL/uL — ABNORMAL LOW (ref 3.87–5.11)
RDW: 17.6 % — AB (ref 11.5–15.5)
RDW: 19.4 % — ABNORMAL HIGH (ref 11.5–15.5)
WBC: 11.6 10*3/uL — AB (ref 4.0–10.5)
WBC: 15.2 10*3/uL — ABNORMAL HIGH (ref 4.0–10.5)

## 2017-10-19 LAB — RENAL FUNCTION PANEL
ALBUMIN: 2 g/dL — AB (ref 3.5–5.0)
Anion gap: 6 (ref 5–15)
BUN: 22 mg/dL (ref 8–23)
CO2: 35 mmol/L — ABNORMAL HIGH (ref 22–32)
CREATININE: 1.26 mg/dL — AB (ref 0.44–1.00)
Calcium: 10.2 mg/dL (ref 8.9–10.3)
Chloride: 96 mmol/L — ABNORMAL LOW (ref 98–111)
GFR calc Af Amer: 44 mL/min — ABNORMAL LOW (ref >60)
GFR, EST NON AFRICAN AMERICAN: 38 mL/min — AB (ref >60)
GLUCOSE: 100 mg/dL — AB (ref 70–99)
PHOSPHORUS: 3.3 mg/dL (ref 2.5–4.6)
Potassium: 3.5 mmol/L (ref 3.5–5.1)
SODIUM: 137 mmol/L (ref 135–145)

## 2017-10-19 LAB — ABO/RH: ABO/RH(D): A POS

## 2017-10-19 LAB — PREPARE RBC (CROSSMATCH)

## 2017-10-19 LAB — MAGNESIUM: MAGNESIUM: 1.7 mg/dL (ref 1.7–2.4)

## 2017-10-20 LAB — BPAM RBC
BLOOD PRODUCT EXPIRATION DATE: 201908102359
Blood Product Expiration Date: 201907312359
ISSUE DATE / TIME: 201907231611
ISSUE DATE / TIME: 201907231253
UNIT TYPE AND RH: 6200
Unit Type and Rh: 6200

## 2017-10-20 LAB — URINALYSIS, ROUTINE W REFLEX MICROSCOPIC
Bilirubin Urine: NEGATIVE
GLUCOSE, UA: NEGATIVE mg/dL
Hgb urine dipstick: NEGATIVE
Ketones, ur: NEGATIVE mg/dL
Nitrite: NEGATIVE
PH: 8 (ref 5.0–8.0)
PROTEIN: 30 mg/dL — AB
SPECIFIC GRAVITY, URINE: 1.011 (ref 1.005–1.030)

## 2017-10-20 LAB — TYPE AND SCREEN
ABO/RH(D): A POS
ANTIBODY SCREEN: NEGATIVE
Unit division: 0
Unit division: 0

## 2017-10-20 LAB — T4, FREE: Free T4: 0.95 ng/dL (ref 0.82–1.77)

## 2017-10-20 LAB — CBC
HEMATOCRIT: 25.1 % — AB (ref 36.0–46.0)
HEMOGLOBIN: 8.1 g/dL — AB (ref 12.0–15.0)
MCH: 27.9 pg (ref 26.0–34.0)
MCHC: 32.3 g/dL (ref 30.0–36.0)
MCV: 86.6 fL (ref 78.0–100.0)
Platelets: 299 10*3/uL (ref 150–400)
RBC: 2.9 MIL/uL — ABNORMAL LOW (ref 3.87–5.11)
RDW: 19.9 % — ABNORMAL HIGH (ref 11.5–15.5)
WBC: 16.2 10*3/uL — AB (ref 4.0–10.5)

## 2017-10-20 LAB — MAGNESIUM: MAGNESIUM: 1.7 mg/dL (ref 1.7–2.4)

## 2017-10-20 LAB — RENAL FUNCTION PANEL
ALBUMIN: 2.1 g/dL — AB (ref 3.5–5.0)
ANION GAP: 8 (ref 5–15)
BUN: 22 mg/dL (ref 8–23)
CALCIUM: 9.9 mg/dL (ref 8.9–10.3)
CO2: 34 mmol/L — ABNORMAL HIGH (ref 22–32)
Chloride: 97 mmol/L — ABNORMAL LOW (ref 98–111)
Creatinine, Ser: 1.13 mg/dL — ABNORMAL HIGH (ref 0.44–1.00)
GFR, EST AFRICAN AMERICAN: 50 mL/min — AB (ref >60)
GFR, EST NON AFRICAN AMERICAN: 43 mL/min — AB (ref >60)
Glucose, Bld: 127 mg/dL — ABNORMAL HIGH (ref 70–99)
PHOSPHORUS: 2.7 mg/dL (ref 2.5–4.6)
POTASSIUM: 3.1 mmol/L — AB (ref 3.5–5.1)
SODIUM: 139 mmol/L (ref 135–145)

## 2017-10-20 LAB — TSH: TSH: 14.015 u[IU]/mL — AB (ref 0.350–4.500)

## 2017-10-20 NOTE — Progress Notes (Signed)
[facility]  ROUNDING NOTE   Subjective:  Patient seen at bedside. She now has an NG tube in place. Renal function continues to improve.   Objective:  Vital signs in last 24 hours:  Temperature 98 pulse 70 respirations 40 blood pressure 166/64  Physical Exam: General: No acute distress  Head: Normocephalic, atraumatic. Moist oral mucosal membranes  Eyes: Anicteric  Neck: Supple, trachea midline  Lungs:  Diminished at bases, normal effort  Heart: S1S2 no rubs  Abdomen:  Soft, nontender, bowel sounds present  Extremities: 1+ peripheral edema.  Neurologic: Awake, alert  Skin: Raised lesion on chest wall  Access: Right femoral dialysis catheter removed    Basic Metabolic Panel: Recent Labs  Lab 10/14/17 0520 10/15/17 0618  10/16/17 0705 10/17/17 0731 10/18/17 0728 10/19/17 0758 10/20/17 0448  NA 138 135   < > 134* 133* 137 137 139  K 3.1* 3.5   < > 3.2* 3.8 3.6 3.5 3.1*  CL 99 95*   < > 95* 95* 97* 96* 97*  CO2 31 32   < > 31 31 33* 35* 34*  GLUCOSE 192* 100*   < > 102* 96 110* 100* 127*  BUN 11 8   < > 6* 9 15 22 22   CREATININE 1.66* 1.25*   < > 1.28* 1.41* 1.36* 1.26* 1.13*  CALCIUM 9.7 8.7*   < > 8.8* 9.0 9.9 10.2 9.9  MG  --  1.7  --  2.1  --   --  1.7 1.7  PHOS 2.6  --   --   --   --   --  3.3 2.7   < > = values in this interval not displayed.    Liver Function Tests: Recent Labs  Lab 10/14/17 0520 10/15/17 1705 10/19/17 0758 10/20/17 0448  AST  --  21  --   --   ALT  --  10  --   --   ALKPHOS  --  112  --   --   BILITOT  --  0.4  --   --   PROT  --  5.9*  --   --   ALBUMIN 2.3* 2.3* 2.0* 2.1*   No results for input(s): LIPASE, AMYLASE in the last 168 hours. Recent Labs  Lab 10/15/17 1705  AMMONIA 34    CBC: Recent Labs  Lab 10/15/17 1705 10/18/17 0728 10/19/17 0758 10/19/17 2111 10/20/17 0448  WBC 9.8 12.0* 11.6* 15.2* 16.2*  NEUTROABS 7.1  --   --   --   --   HGB 7.3* 7.3* 5.5* 8.5* 8.1*  HCT 24.0* 23.3* 18.2* 26.4*  25.1*  MCV 92.0 91.7 93.3 87.1 86.6  PLT 214 280 309 306 299    Cardiac Enzymes: No results for input(s): CKTOTAL, CKMB, CKMBINDEX, TROPONINI in the last 168 hours.  BNP: Invalid input(s): POCBNP  CBG: No results for input(s): GLUCAP in the last 168 hours.  Microbiology: Results for orders placed or performed during the hospital encounter of 10/02/17  Culture, body fluid-bottle     Status: None   Collection Time: 10/04/17 12:53 PM  Result Value Ref Range Status   Specimen Description PLEURAL  Final   Special Requests NONE  Final   Culture   Final    NO GROWTH 5 DAYS Performed at [facility], [address] [place], [place] [id number]    Report Status 10/09/2017 FINAL  Final  Gram stain     Status: None   Collection  Time: 10/04/17 12:53 PM  Result Value Ref Range Status   Specimen Description PLEURAL  Final   Special Requests NONE  Final   Gram Stain   Final    FEW WBC PRESENT,BOTH PMN AND MONONUCLEAR NO ORGANISMS SEEN Performed at [facility], [address], [place], [place] [address]    Report Status 10/04/2017 FINAL  Final  Culture, body fluid-bottle     Status: None   Collection Time: 10/06/17  4:14 PM  Result Value Ref Range Status   Specimen Description PLEURAL LEFT  Final   Special Requests NONE  Final   Culture   Final    NO GROWTH 5 DAYS Performed at [facility], [address] [place], [place] [address]    Report Status 10/11/2017 FINAL  Final  Gram stain     Status: None   Collection Time: 10/06/17  4:14 PM  Result Value Ref Range Status   Specimen Description PLEURAL LEFT  Final   Special Requests NONE  Final   Gram Stain   Final    ABUNDANT WBC PRESENT, PREDOMINANTLY MONONUCLEAR NO ORGANISMS SEEN Performed at [facility], [address], [place], [place] [address]    Report Status 10/06/2017 FINAL  Final  Culture, body fluid-bottle     Status: None (Preliminary result)   Collection Time: 10/16/17 11:00 AM  Result  Value Ref Range Status   Specimen Description PLEURAL  Final   Special Requests FLUID  Final   Culture   Final    NO GROWTH 3 DAYS Performed at [facility], [address] [place], [place] [address]    Report Status PENDING  Incomplete  Gram stain     Status: None   Collection Time: 10/16/17 11:00 AM  Result Value Ref Range Status   Specimen Description PLEURAL  Final   Special Requests FLUID  Final   Gram Stain   Final    WBC PRESENT,BOTH PMN AND MONONUCLEAR NO ORGANISMS SEEN CYTOSPIN SMEAR Performed at [facility], [address], [place], [place] [address]    Report Status 10/16/2017 FINAL  Final    Coagulation Studies: No results for input(s): LABPROT, INR in the last 72 hours.  Urinalysis: No results for input(s): COLORURINE, LABSPEC, PHURINE, GLUCOSEU, HGBUR, BILIRUBINUR, KETONESUR, PROTEINUR, UROBILINOGEN, NITRITE, LEUKOCYTESUR in the last 72 hours.  Invalid input(s): APPERANCEUR    Imaging: Dg Chest Port 1 View  Result Date: 10/19/2017 CLINICAL DATA:  Shortness of breath. EXAM: PORTABLE CHEST 1 VIEW COMPARISON:  10/18/2017. FINDINGS: Feeding tube noted stable position. Cardiomegaly with diffuse bilateral infiltrates/edema and bilateral pleural effusions again noted. Similar findings noted on prior exam. No pneumothorax. Left costophrenic angle not imaged. IMPRESSION: 1.  Feeding tube noted stable position. 2. Cardiomegaly with diffuse bilateral pulmonary infiltrates/edema and bilateral pleural effusions again noted. Similar findings noted on prior exam. Electronically Signed   By: Maisie Fus  Register   On: 10/19/2017 07:12   Dg Chest Port 1 View  Result Date: 10/18/2017 CLINICAL DATA:  Pleural effusion, post thoracentesis EXAM: PORTABLE CHEST 1 VIEW COMPARISON:  10/16/2017 FINDINGS: Small to moderate bilateral pleural effusions are similar to prior study. No pneumothorax. Cardiomegaly with diffuse bilateral airspace disease, slightly improved. Feeding tube  enters the stomach. IMPRESSION: Bilateral pleural effusions and diffuse bilateral airspace disease, with slight improvement in airspace disease since prior study, likely improving edema. Electronically Signed   By: Charlett Nose M.D.   On: 10/18/2017 14:04  Medications:       Assessment/ Plan:  84 y.o. female with a PMHx of diastolic heart failure, right-sided pleural effusion with chronic Pleurx catheter since April 2019, paroxysmal atrial fibrillation, hypertension, hyperlipidemia who was originally admitted to [facility] on September 18, 2017 with right-sided chest pain.  She was apparently having issues with her Pleurx catheter.  After admission she underwent bilateral thoracentesis.  Surgery subsequently removed her Pleurx catheter as it was felt that it may possibly be infected.  Patient was also diagnosed with sepsis and hospital-acquired pneumonia. She was initially started on vancomycin and Zosyn.  Unfortunately she underwent ventricular fibrillation arrest on September 25, 2017.  Patient also had dialysis at outside hospital.   1.  Acute renal failure.  2.  Anemia unspecified.  3.  Acute on chronic diastolic heart failure.  4.  Bilateral pleural effusions.  Plan:  Renal function continues to improve.  Creatinine now down to 1.1.  She is receiving tube feeds now.  Serum potassium low at 3.1.  Repletion as per hospitalist protocol.  Hemoglobin currently 8.1.  No indication for transfusion at the moment.  Otherwise continue supportive care.     LOS: 0 Eulon Allnutt 7/24/20198:58 AM

## 2017-10-21 ENCOUNTER — Other Ambulatory Visit (HOSPITAL_COMMUNITY): Payer: Self-pay

## 2017-10-21 LAB — CBC
HCT: 27.7 % — ABNORMAL LOW (ref 36.0–46.0)
Hemoglobin: 8.8 g/dL — ABNORMAL LOW (ref 12.0–15.0)
MCH: 27.9 pg (ref 26.0–34.0)
MCHC: 31.8 g/dL (ref 30.0–36.0)
MCV: 87.9 fL (ref 78.0–100.0)
PLATELETS: 293 10*3/uL (ref 150–400)
RBC: 3.15 MIL/uL — ABNORMAL LOW (ref 3.87–5.11)
RDW: 19.9 % — AB (ref 11.5–15.5)
WBC: 14.9 10*3/uL — AB (ref 4.0–10.5)

## 2017-10-21 LAB — RENAL FUNCTION PANEL
Albumin: 2.2 g/dL — ABNORMAL LOW (ref 3.5–5.0)
Anion gap: 9 (ref 5–15)
BUN: 19 mg/dL (ref 8–23)
CALCIUM: 10.3 mg/dL (ref 8.9–10.3)
CO2: 33 mmol/L — ABNORMAL HIGH (ref 22–32)
CREATININE: 0.91 mg/dL (ref 0.44–1.00)
Chloride: 97 mmol/L — ABNORMAL LOW (ref 98–111)
GFR calc Af Amer: >60 mL/min (ref >60)
GFR calc non Af Amer: 56 mL/min — ABNORMAL LOW (ref >60)
GLUCOSE: 106 mg/dL — AB (ref 70–99)
PHOSPHORUS: 2.8 mg/dL (ref 2.5–4.6)
Potassium: 3.6 mmol/L (ref 3.5–5.1)
SODIUM: 139 mmol/L (ref 135–145)

## 2017-10-21 LAB — URINE CULTURE

## 2017-10-21 LAB — CULTURE, BODY FLUID W GRAM STAIN -BOTTLE: Culture: NO GROWTH

## 2017-10-21 LAB — MAGNESIUM: MAGNESIUM: 1.9 mg/dL (ref 1.7–2.4)

## 2017-10-22 ENCOUNTER — Other Ambulatory Visit (HOSPITAL_COMMUNITY): Payer: Self-pay

## 2017-10-22 LAB — CULTURE, RESPIRATORY W GRAM STAIN

## 2017-10-22 LAB — BASIC METABOLIC PANEL
Anion gap: 10 (ref 5–15)
BUN: 20 mg/dL (ref 8–23)
CALCIUM: 10.3 mg/dL (ref 8.9–10.3)
CO2: 34 mmol/L — ABNORMAL HIGH (ref 22–32)
CREATININE: 0.87 mg/dL (ref 0.44–1.00)
Chloride: 96 mmol/L — ABNORMAL LOW (ref 98–111)
GFR calc Af Amer: >60 mL/min (ref >60)
GFR, EST NON AFRICAN AMERICAN: 59 mL/min — AB (ref >60)
GLUCOSE: 109 mg/dL — AB (ref 70–99)
Potassium: 3.2 mmol/L — ABNORMAL LOW (ref 3.5–5.1)
SODIUM: 140 mmol/L (ref 135–145)

## 2017-10-22 LAB — URINALYSIS, ROUTINE W REFLEX MICROSCOPIC
Bilirubin Urine: NEGATIVE
GLUCOSE, UA: NEGATIVE mg/dL
Hgb urine dipstick: NEGATIVE
KETONES UR: NEGATIVE mg/dL
Nitrite: NEGATIVE
Protein, ur: 30 mg/dL — AB
SPECIFIC GRAVITY, URINE: 1.012 (ref 1.005–1.030)
pH: 8 (ref 5.0–8.0)

## 2017-10-22 LAB — CULTURE, RESPIRATORY

## 2017-10-23 LAB — BASIC METABOLIC PANEL
ANION GAP: 6 (ref 5–15)
BUN: 12 mg/dL (ref 8–23)
CHLORIDE: 98 mmol/L (ref 98–111)
CO2: 39 mmol/L — AB (ref 22–32)
Calcium: 9.8 mg/dL (ref 8.9–10.3)
Creatinine, Ser: 0.95 mg/dL (ref 0.44–1.00)
GFR calc non Af Amer: 53 mL/min — ABNORMAL LOW (ref >60)
Glucose, Bld: 84 mg/dL (ref 70–99)
POTASSIUM: 3.4 mmol/L — AB (ref 3.5–5.1)
Sodium: 143 mmol/L (ref 135–145)

## 2017-10-23 LAB — CBC
HEMATOCRIT: 27.2 % — AB (ref 36.0–46.0)
Hemoglobin: 8.1 g/dL — ABNORMAL LOW (ref 12.0–15.0)
MCH: 27.6 pg (ref 26.0–34.0)
MCHC: 29.8 g/dL — ABNORMAL LOW (ref 30.0–36.0)
MCV: 92.5 fL (ref 78.0–100.0)
Platelets: 383 10*3/uL (ref 150–400)
RBC: 2.94 MIL/uL — AB (ref 3.87–5.11)
RDW: 19.8 % — ABNORMAL HIGH (ref 11.5–15.5)
WBC: 8.5 10*3/uL (ref 4.0–10.5)

## 2017-10-23 LAB — PHOSPHORUS: Phosphorus: 3.5 mg/dL (ref 2.5–4.6)

## 2017-10-23 LAB — MAGNESIUM: Magnesium: 1.8 mg/dL (ref 1.7–2.4)

## 2017-10-24 LAB — BASIC METABOLIC PANEL
Anion gap: 9 (ref 5–15)
BUN: 11 mg/dL (ref 8–23)
CHLORIDE: 95 mmol/L — AB (ref 98–111)
CO2: 36 mmol/L — ABNORMAL HIGH (ref 22–32)
CREATININE: 0.99 mg/dL (ref 0.44–1.00)
Calcium: 9.4 mg/dL (ref 8.9–10.3)
GFR calc Af Amer: 59 mL/min — ABNORMAL LOW (ref >60)
GFR calc non Af Amer: 51 mL/min — ABNORMAL LOW (ref >60)
GLUCOSE: 89 mg/dL (ref 70–99)
POTASSIUM: 3.6 mmol/L (ref 3.5–5.1)
Sodium: 140 mmol/L (ref 135–145)

## 2017-10-25 ENCOUNTER — Other Ambulatory Visit (HOSPITAL_COMMUNITY): Payer: Self-pay

## 2017-10-25 LAB — RENAL FUNCTION PANEL
ALBUMIN: 2.3 g/dL — AB (ref 3.5–5.0)
ANION GAP: 9 (ref 5–15)
BUN: 8 mg/dL (ref 8–23)
CALCIUM: 9.3 mg/dL (ref 8.9–10.3)
CO2: 37 mmol/L — ABNORMAL HIGH (ref 22–32)
Chloride: 92 mmol/L — ABNORMAL LOW (ref 98–111)
Creatinine, Ser: 0.96 mg/dL (ref 0.44–1.00)
GFR calc non Af Amer: 52 mL/min — ABNORMAL LOW (ref >60)
Glucose, Bld: 94 mg/dL (ref 70–99)
PHOSPHORUS: 3.3 mg/dL (ref 2.5–4.6)
POTASSIUM: 3.5 mmol/L (ref 3.5–5.1)
SODIUM: 138 mmol/L (ref 135–145)

## 2017-10-25 LAB — CBC
HCT: 28 % — ABNORMAL LOW (ref 36.0–46.0)
Hemoglobin: 8.4 g/dL — ABNORMAL LOW (ref 12.0–15.0)
MCH: 27.5 pg (ref 26.0–34.0)
MCHC: 30 g/dL (ref 30.0–36.0)
MCV: 91.8 fL (ref 78.0–100.0)
Platelets: 395 10*3/uL (ref 150–400)
RBC: 3.05 MIL/uL — AB (ref 3.87–5.11)
RDW: 18.9 % — ABNORMAL HIGH (ref 11.5–15.5)
WBC: 8.9 10*3/uL (ref 4.0–10.5)

## 2017-10-25 LAB — PHOSPHORUS: PHOSPHORUS: 3.3 mg/dL (ref 2.5–4.6)

## 2017-10-25 LAB — MAGNESIUM: Magnesium: 1.6 mg/dL — ABNORMAL LOW (ref 1.7–2.4)

## 2018-08-12 IMAGING — DX DG CHEST 1V PORT
1 series · 1 of 1 positions shown · non-contrast
Comparison: Chest x-ray from yesterday.

CLINICAL DATA: Bilateral pleural effusions.

EXAM:
PORTABLE CHEST 1 VIEW

[chest]
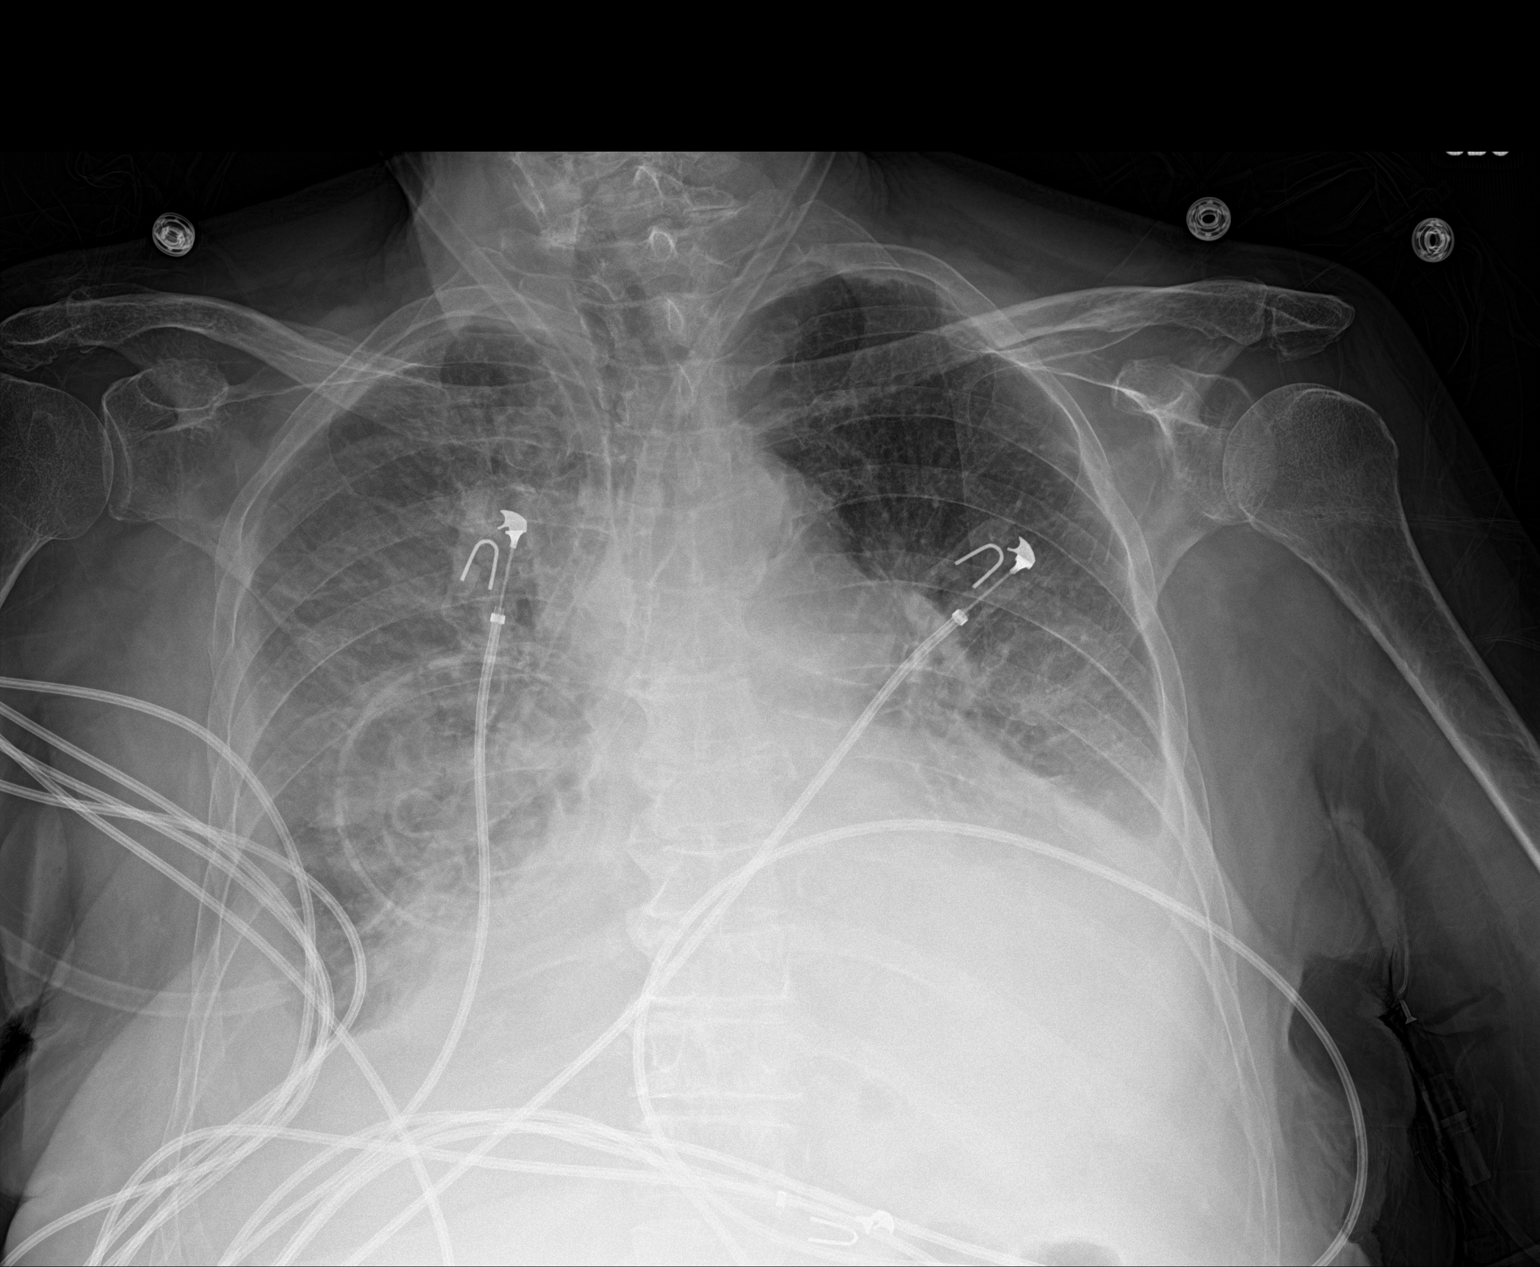

[1 of 1 positions shown; findings below may reference images not displayed]

FINDINGS: Stable cardiomegaly with pulmonary vascular congestion and mild
interstitial edema. Unchanged small to moderate bilateral pleural
effusions and bibasilar atelectasis. No pneumothorax. No acute
osseous abnormality.
IMPRESSION: 1. Stable interstitial edema and small to moderate bilateral pleural
effusions.

## 2018-08-18 IMAGING — DX DG ABD PORTABLE 1V
1 series · 1 of 1 positions shown · non-contrast
Comparison: None.

CLINICAL DATA: Nausea

EXAM:
PORTABLE ABDOMEN - 1 VIEW

[abdomen kub]
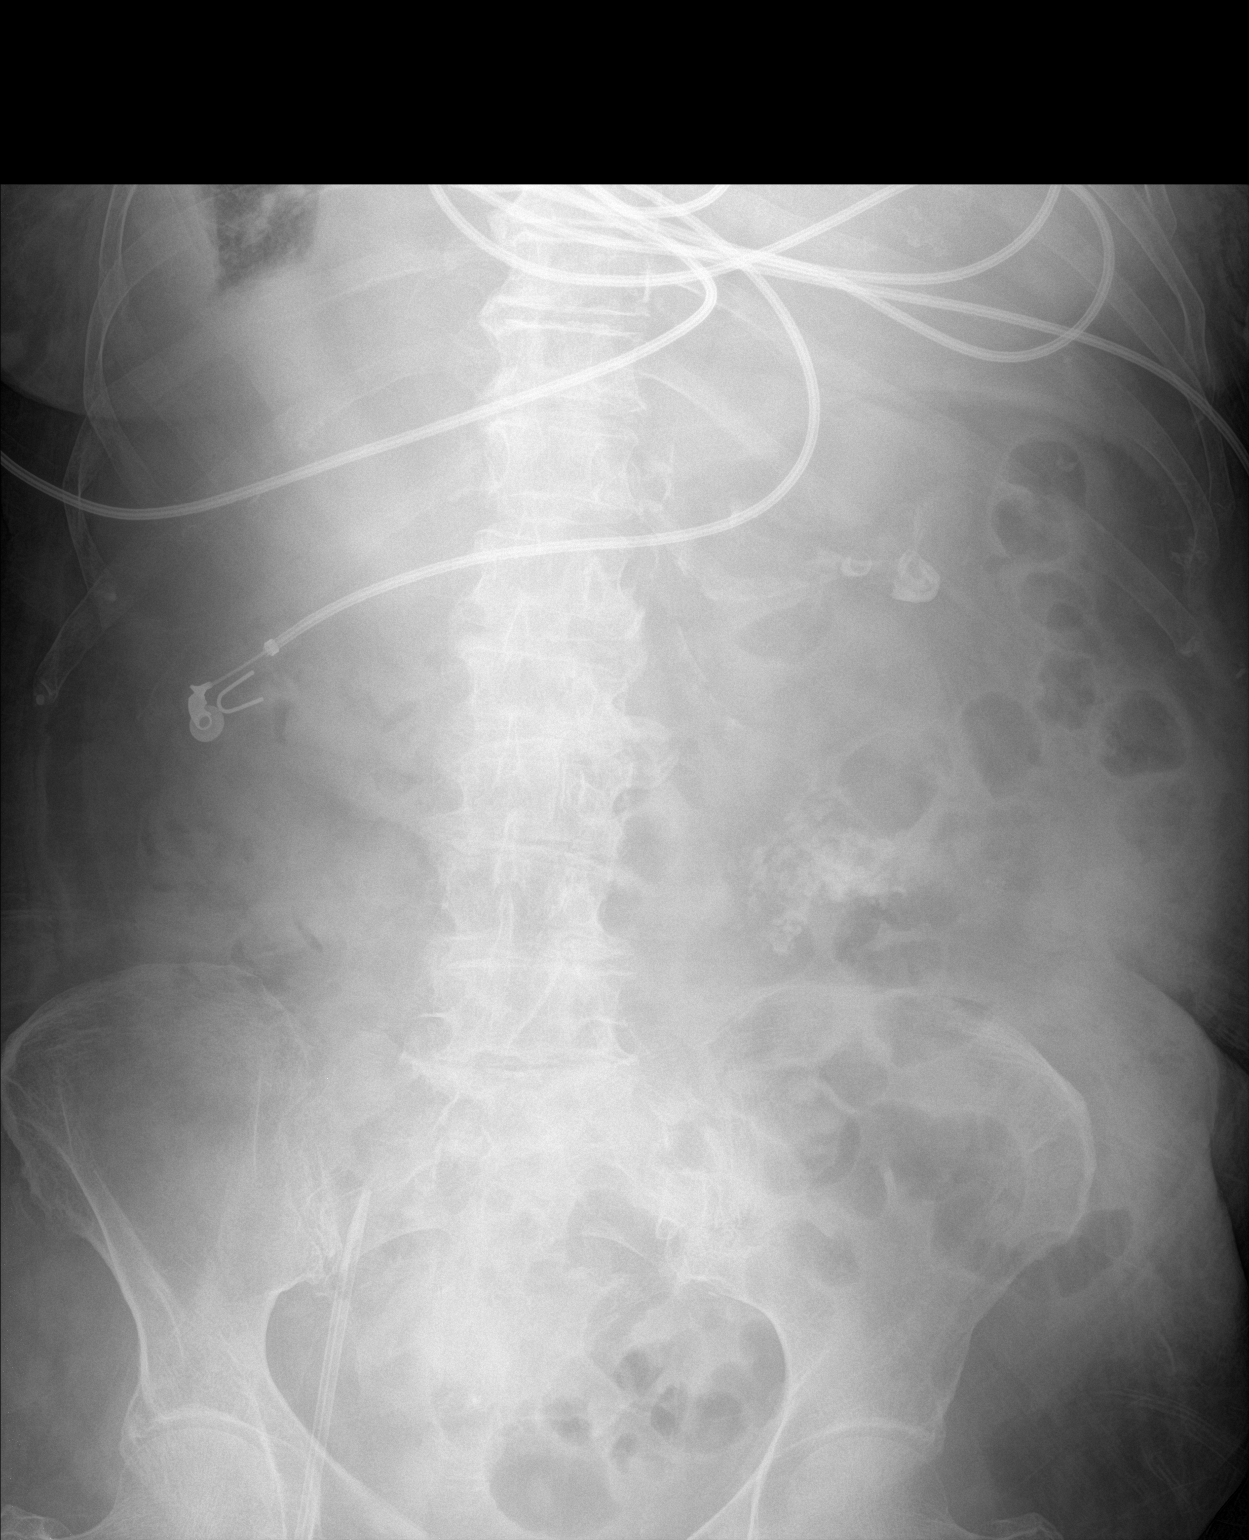

[1 of 1 positions shown; findings below may reference images not displayed]

FINDINGS: The bowel gas pattern is normal. Left upper quadrant calcifications
are probably splenic artery vascular calcifications. Left lower
quadrant density may be dense stool. There is a right femoral
approach catheter. Bilateral pleural effusions.
IMPRESSION: No radiographic evidence of small-bowel obstruction.

## 2018-08-21 IMAGING — DX DG CHEST 1V PORT
1 series · 1 of 1 positions shown · non-contrast
Comparison: 10/16/2017

CLINICAL DATA: Pleural effusion, post thoracentesis

EXAM:
PORTABLE CHEST 1 VIEW

[chest ap]
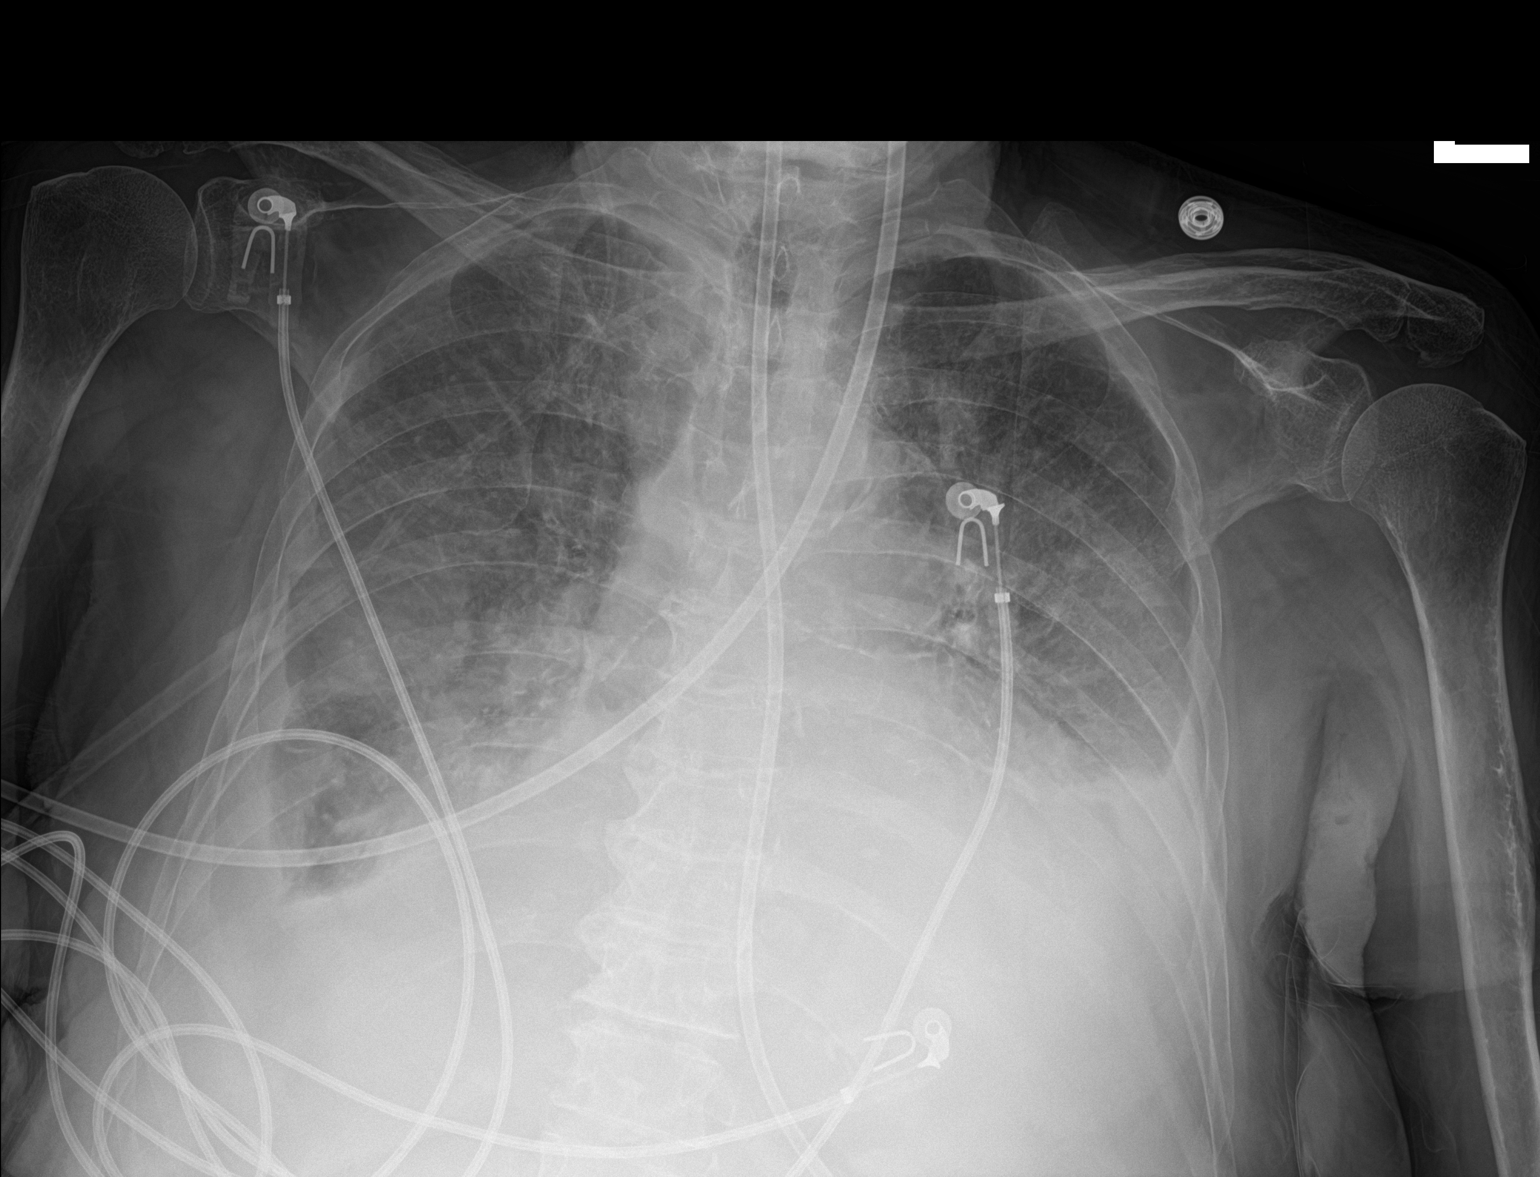

[1 of 1 positions shown; findings below may reference images not displayed]

FINDINGS: Small to moderate bilateral pleural effusions are similar to prior
study. No pneumothorax. Cardiomegaly with diffuse bilateral airspace
disease, slightly improved. Feeding tube enters the stomach.
IMPRESSION: Bilateral pleural effusions and diffuse bilateral airspace disease,
with slight improvement in airspace disease since prior study,
likely improving edema.

## 2023-05-01 DEATH — deceased
